# Patient Record
Sex: Male | Born: 1945
Health system: Southern US, Community
[De-identification: ages and names within clinical notes are randomized; demographics above are authoritative.]

## PROBLEM LIST (undated history)

## (undated) DIAGNOSIS — C4490 Unspecified malignant neoplasm of skin, unspecified: Secondary | ICD-10-CM

## (undated) DIAGNOSIS — E119 Type 2 diabetes mellitus without complications: Secondary | ICD-10-CM

## (undated) DIAGNOSIS — I011 Acute rheumatic endocarditis: Secondary | ICD-10-CM

## (undated) DIAGNOSIS — I10 Essential (primary) hypertension: Secondary | ICD-10-CM

## (undated) HISTORY — PX: FOOT SURGERY: SHX648

## (undated) HISTORY — DX: Unspecified malignant neoplasm of skin, unspecified: C44.90

## (undated) HISTORY — PX: ELBOW SURGERY: SHX618

## (undated) HISTORY — PX: CARPAL TUNNEL RELEASE: SHX101

## (undated) HISTORY — DX: Acute rheumatic endocarditis: I01.1

## (undated) HISTORY — DX: Essential (primary) hypertension: I10

## (undated) HISTORY — DX: Type 2 diabetes mellitus without complications: E11.9

---

## 2016-03-30 DIAGNOSIS — R1310 Dysphagia, unspecified: Secondary | ICD-10-CM | POA: Diagnosis not present

## 2016-04-13 DIAGNOSIS — T17300A Unspecified foreign body in larynx causing asphyxiation, initial encounter: Secondary | ICD-10-CM | POA: Diagnosis not present

## 2016-04-13 DIAGNOSIS — R1310 Dysphagia, unspecified: Secondary | ICD-10-CM | POA: Diagnosis not present

## 2016-05-10 DIAGNOSIS — M059 Rheumatoid arthritis with rheumatoid factor, unspecified: Secondary | ICD-10-CM | POA: Diagnosis not present

## 2016-05-10 DIAGNOSIS — Z79899 Other long term (current) drug therapy: Secondary | ICD-10-CM | POA: Diagnosis not present

## 2016-05-23 DIAGNOSIS — M059 Rheumatoid arthritis with rheumatoid factor, unspecified: Secondary | ICD-10-CM | POA: Diagnosis not present

## 2016-05-23 DIAGNOSIS — Z13820 Encounter for screening for osteoporosis: Secondary | ICD-10-CM | POA: Diagnosis not present

## 2016-05-23 DIAGNOSIS — R202 Paresthesia of skin: Secondary | ICD-10-CM | POA: Diagnosis not present

## 2016-05-23 DIAGNOSIS — Z79899 Other long term (current) drug therapy: Secondary | ICD-10-CM | POA: Diagnosis not present

## 2016-06-07 DIAGNOSIS — E559 Vitamin D deficiency, unspecified: Secondary | ICD-10-CM | POA: Diagnosis not present

## 2016-06-07 DIAGNOSIS — Z7952 Long term (current) use of systemic steroids: Secondary | ICD-10-CM | POA: Diagnosis not present

## 2016-06-07 DIAGNOSIS — E1165 Type 2 diabetes mellitus with hyperglycemia: Secondary | ICD-10-CM | POA: Diagnosis not present

## 2016-06-07 DIAGNOSIS — I10 Essential (primary) hypertension: Secondary | ICD-10-CM | POA: Diagnosis not present

## 2016-06-15 DIAGNOSIS — R1310 Dysphagia, unspecified: Secondary | ICD-10-CM | POA: Diagnosis not present

## 2016-06-20 DIAGNOSIS — M859 Disorder of bone density and structure, unspecified: Secondary | ICD-10-CM | POA: Diagnosis not present

## 2016-06-21 DIAGNOSIS — E1165 Type 2 diabetes mellitus with hyperglycemia: Secondary | ICD-10-CM | POA: Diagnosis not present

## 2016-07-06 DIAGNOSIS — M059 Rheumatoid arthritis with rheumatoid factor, unspecified: Secondary | ICD-10-CM | POA: Diagnosis not present

## 2016-07-06 DIAGNOSIS — Z79899 Other long term (current) drug therapy: Secondary | ICD-10-CM | POA: Diagnosis not present

## 2016-08-30 DIAGNOSIS — M059 Rheumatoid arthritis with rheumatoid factor, unspecified: Secondary | ICD-10-CM | POA: Diagnosis not present

## 2016-08-30 DIAGNOSIS — Z79899 Other long term (current) drug therapy: Secondary | ICD-10-CM | POA: Diagnosis not present

## 2016-08-31 DIAGNOSIS — M059 Rheumatoid arthritis with rheumatoid factor, unspecified: Secondary | ICD-10-CM | POA: Diagnosis not present

## 2016-08-31 DIAGNOSIS — Z79899 Other long term (current) drug therapy: Secondary | ICD-10-CM | POA: Diagnosis not present

## 2016-09-06 DIAGNOSIS — D1800 Hemangioma unspecified site: Secondary | ICD-10-CM | POA: Diagnosis not present

## 2016-09-06 DIAGNOSIS — L570 Actinic keratosis: Secondary | ICD-10-CM | POA: Diagnosis not present

## 2016-09-06 DIAGNOSIS — L814 Other melanin hyperpigmentation: Secondary | ICD-10-CM | POA: Diagnosis not present

## 2016-09-06 DIAGNOSIS — L821 Other seborrheic keratosis: Secondary | ICD-10-CM | POA: Diagnosis not present

## 2016-09-06 DIAGNOSIS — L820 Inflamed seborrheic keratosis: Secondary | ICD-10-CM | POA: Diagnosis not present

## 2016-09-06 DIAGNOSIS — Z85828 Personal history of other malignant neoplasm of skin: Secondary | ICD-10-CM | POA: Diagnosis not present

## 2016-09-06 DIAGNOSIS — L298 Other pruritus: Secondary | ICD-10-CM | POA: Diagnosis not present

## 2016-09-06 DIAGNOSIS — D485 Neoplasm of uncertain behavior of skin: Secondary | ICD-10-CM | POA: Diagnosis not present

## 2016-09-06 DIAGNOSIS — Z08 Encounter for follow-up examination after completed treatment for malignant neoplasm: Secondary | ICD-10-CM | POA: Diagnosis not present

## 2016-09-06 DIAGNOSIS — D229 Melanocytic nevi, unspecified: Secondary | ICD-10-CM | POA: Diagnosis not present

## 2016-09-13 DIAGNOSIS — G629 Polyneuropathy, unspecified: Secondary | ICD-10-CM | POA: Diagnosis not present

## 2016-09-13 DIAGNOSIS — G608 Other hereditary and idiopathic neuropathies: Secondary | ICD-10-CM | POA: Diagnosis not present

## 2016-09-13 DIAGNOSIS — I10 Essential (primary) hypertension: Secondary | ICD-10-CM | POA: Diagnosis not present

## 2016-09-13 DIAGNOSIS — E1165 Type 2 diabetes mellitus with hyperglycemia: Secondary | ICD-10-CM | POA: Diagnosis not present

## 2016-09-13 DIAGNOSIS — E559 Vitamin D deficiency, unspecified: Secondary | ICD-10-CM | POA: Diagnosis not present

## 2016-09-20 DIAGNOSIS — M79672 Pain in left foot: Secondary | ICD-10-CM | POA: Diagnosis not present

## 2016-09-26 DIAGNOSIS — E119 Type 2 diabetes mellitus without complications: Secondary | ICD-10-CM | POA: Diagnosis not present

## 2016-09-26 DIAGNOSIS — M059 Rheumatoid arthritis with rheumatoid factor, unspecified: Secondary | ICD-10-CM | POA: Diagnosis not present

## 2016-09-26 DIAGNOSIS — Z794 Long term (current) use of insulin: Secondary | ICD-10-CM | POA: Diagnosis not present

## 2016-09-26 DIAGNOSIS — R001 Bradycardia, unspecified: Secondary | ICD-10-CM | POA: Diagnosis not present

## 2016-09-29 DIAGNOSIS — M50323 Other cervical disc degeneration at C6-C7 level: Secondary | ICD-10-CM | POA: Diagnosis not present

## 2016-09-29 DIAGNOSIS — M5137 Other intervertebral disc degeneration, lumbosacral region: Secondary | ICD-10-CM | POA: Diagnosis not present

## 2016-09-29 DIAGNOSIS — M50322 Other cervical disc degeneration at C5-C6 level: Secondary | ICD-10-CM | POA: Diagnosis not present

## 2016-09-29 DIAGNOSIS — M545 Low back pain: Secondary | ICD-10-CM | POA: Diagnosis not present

## 2016-09-29 DIAGNOSIS — E1142 Type 2 diabetes mellitus with diabetic polyneuropathy: Secondary | ICD-10-CM | POA: Diagnosis not present

## 2016-09-29 DIAGNOSIS — M4316 Spondylolisthesis, lumbar region: Secondary | ICD-10-CM | POA: Diagnosis not present

## 2016-09-29 DIAGNOSIS — Z6837 Body mass index (BMI) 37.0-37.9, adult: Secondary | ICD-10-CM | POA: Diagnosis not present

## 2016-09-29 DIAGNOSIS — R208 Other disturbances of skin sensation: Secondary | ICD-10-CM | POA: Diagnosis not present

## 2016-10-11 DIAGNOSIS — M4316 Spondylolisthesis, lumbar region: Secondary | ICD-10-CM | POA: Diagnosis not present

## 2016-10-11 DIAGNOSIS — M5137 Other intervertebral disc degeneration, lumbosacral region: Secondary | ICD-10-CM | POA: Diagnosis not present

## 2016-10-25 DIAGNOSIS — Z6837 Body mass index (BMI) 37.0-37.9, adult: Secondary | ICD-10-CM | POA: Diagnosis not present

## 2016-10-25 DIAGNOSIS — G629 Polyneuropathy, unspecified: Secondary | ICD-10-CM | POA: Diagnosis not present

## 2016-10-25 DIAGNOSIS — E1165 Type 2 diabetes mellitus with hyperglycemia: Secondary | ICD-10-CM | POA: Diagnosis not present

## 2016-10-27 DIAGNOSIS — M05821 Other rheumatoid arthritis with rheumatoid factor of right elbow: Secondary | ICD-10-CM | POA: Diagnosis not present

## 2016-10-27 DIAGNOSIS — M05822 Other rheumatoid arthritis with rheumatoid factor of left elbow: Secondary | ICD-10-CM | POA: Diagnosis not present

## 2016-10-27 DIAGNOSIS — M069 Rheumatoid arthritis, unspecified: Secondary | ICD-10-CM | POA: Diagnosis not present

## 2016-11-08 DIAGNOSIS — R208 Other disturbances of skin sensation: Secondary | ICD-10-CM | POA: Diagnosis not present

## 2016-11-22 DIAGNOSIS — M8580 Other specified disorders of bone density and structure, unspecified site: Secondary | ICD-10-CM | POA: Diagnosis not present

## 2016-11-22 DIAGNOSIS — G629 Polyneuropathy, unspecified: Secondary | ICD-10-CM | POA: Diagnosis not present

## 2016-11-22 DIAGNOSIS — M79671 Pain in right foot: Secondary | ICD-10-CM | POA: Diagnosis not present

## 2016-11-22 DIAGNOSIS — M059 Rheumatoid arthritis with rheumatoid factor, unspecified: Secondary | ICD-10-CM | POA: Diagnosis not present

## 2016-11-22 DIAGNOSIS — Z79899 Other long term (current) drug therapy: Secondary | ICD-10-CM | POA: Diagnosis not present

## 2016-11-22 DIAGNOSIS — G8929 Other chronic pain: Secondary | ICD-10-CM | POA: Diagnosis not present

## 2016-11-22 DIAGNOSIS — M79672 Pain in left foot: Secondary | ICD-10-CM | POA: Diagnosis not present

## 2016-11-30 DIAGNOSIS — M50323 Other cervical disc degeneration at C6-C7 level: Secondary | ICD-10-CM | POA: Diagnosis not present

## 2016-11-30 DIAGNOSIS — M50322 Other cervical disc degeneration at C5-C6 level: Secondary | ICD-10-CM | POA: Diagnosis not present

## 2016-11-30 DIAGNOSIS — E1142 Type 2 diabetes mellitus with diabetic polyneuropathy: Secondary | ICD-10-CM | POA: Diagnosis not present

## 2016-11-30 DIAGNOSIS — G5602 Carpal tunnel syndrome, left upper limb: Secondary | ICD-10-CM | POA: Diagnosis not present

## 2016-11-30 DIAGNOSIS — M5137 Other intervertebral disc degeneration, lumbosacral region: Secondary | ICD-10-CM | POA: Diagnosis not present

## 2016-11-30 DIAGNOSIS — Z6837 Body mass index (BMI) 37.0-37.9, adult: Secondary | ICD-10-CM | POA: Diagnosis not present

## 2016-11-30 DIAGNOSIS — M436 Torticollis: Secondary | ICD-10-CM | POA: Diagnosis not present

## 2016-12-15 DIAGNOSIS — G5602 Carpal tunnel syndrome, left upper limb: Secondary | ICD-10-CM | POA: Diagnosis not present

## 2016-12-15 DIAGNOSIS — G5622 Lesion of ulnar nerve, left upper limb: Secondary | ICD-10-CM | POA: Diagnosis not present

## 2016-12-20 DIAGNOSIS — E119 Type 2 diabetes mellitus without complications: Secondary | ICD-10-CM | POA: Diagnosis not present

## 2016-12-20 DIAGNOSIS — E559 Vitamin D deficiency, unspecified: Secondary | ICD-10-CM | POA: Diagnosis not present

## 2016-12-20 DIAGNOSIS — I493 Ventricular premature depolarization: Secondary | ICD-10-CM | POA: Diagnosis not present

## 2016-12-20 DIAGNOSIS — I10 Essential (primary) hypertension: Secondary | ICD-10-CM | POA: Diagnosis not present

## 2016-12-22 DIAGNOSIS — M069 Rheumatoid arthritis, unspecified: Secondary | ICD-10-CM | POA: Diagnosis not present

## 2016-12-22 DIAGNOSIS — M05821 Other rheumatoid arthritis with rheumatoid factor of right elbow: Secondary | ICD-10-CM | POA: Diagnosis not present

## 2016-12-22 DIAGNOSIS — M05822 Other rheumatoid arthritis with rheumatoid factor of left elbow: Secondary | ICD-10-CM | POA: Diagnosis not present

## 2016-12-23 DIAGNOSIS — M545 Low back pain: Secondary | ICD-10-CM | POA: Diagnosis not present

## 2016-12-27 DIAGNOSIS — E1165 Type 2 diabetes mellitus with hyperglycemia: Secondary | ICD-10-CM | POA: Diagnosis not present

## 2016-12-27 DIAGNOSIS — Z23 Encounter for immunization: Secondary | ICD-10-CM | POA: Diagnosis not present

## 2016-12-27 DIAGNOSIS — I10 Essential (primary) hypertension: Secondary | ICD-10-CM | POA: Diagnosis not present

## 2016-12-27 DIAGNOSIS — G629 Polyneuropathy, unspecified: Secondary | ICD-10-CM | POA: Diagnosis not present

## 2017-01-09 DIAGNOSIS — M48061 Spinal stenosis, lumbar region without neurogenic claudication: Secondary | ICD-10-CM | POA: Diagnosis not present

## 2017-01-09 DIAGNOSIS — M5137 Other intervertebral disc degeneration, lumbosacral region: Secondary | ICD-10-CM | POA: Diagnosis not present

## 2017-01-09 DIAGNOSIS — M5032 Other cervical disc degeneration, mid-cervical region, unspecified level: Secondary | ICD-10-CM | POA: Diagnosis not present

## 2017-01-09 DIAGNOSIS — M50323 Other cervical disc degeneration at C6-C7 level: Secondary | ICD-10-CM | POA: Diagnosis not present

## 2017-01-09 DIAGNOSIS — M4316 Spondylolisthesis, lumbar region: Secondary | ICD-10-CM | POA: Diagnosis not present

## 2017-01-09 DIAGNOSIS — E1142 Type 2 diabetes mellitus with diabetic polyneuropathy: Secondary | ICD-10-CM | POA: Diagnosis not present

## 2017-01-09 DIAGNOSIS — G5602 Carpal tunnel syndrome, left upper limb: Secondary | ICD-10-CM | POA: Diagnosis not present

## 2017-01-09 DIAGNOSIS — Z6837 Body mass index (BMI) 37.0-37.9, adult: Secondary | ICD-10-CM | POA: Diagnosis not present

## 2017-01-09 DIAGNOSIS — M50322 Other cervical disc degeneration at C5-C6 level: Secondary | ICD-10-CM | POA: Diagnosis not present

## 2017-01-24 DIAGNOSIS — E662 Morbid (severe) obesity with alveolar hypoventilation: Secondary | ICD-10-CM | POA: Diagnosis not present

## 2017-01-24 DIAGNOSIS — M48062 Spinal stenosis, lumbar region with neurogenic claudication: Secondary | ICD-10-CM | POA: Diagnosis not present

## 2017-01-24 DIAGNOSIS — M4316 Spondylolisthesis, lumbar region: Secondary | ICD-10-CM | POA: Diagnosis not present

## 2017-02-14 DIAGNOSIS — Z79899 Other long term (current) drug therapy: Secondary | ICD-10-CM | POA: Diagnosis not present

## 2017-02-14 DIAGNOSIS — M069 Rheumatoid arthritis, unspecified: Secondary | ICD-10-CM | POA: Diagnosis not present

## 2017-02-20 DIAGNOSIS — R42 Dizziness and giddiness: Secondary | ICD-10-CM | POA: Diagnosis not present

## 2017-02-20 DIAGNOSIS — I10 Essential (primary) hypertension: Secondary | ICD-10-CM | POA: Diagnosis not present

## 2017-02-20 DIAGNOSIS — I493 Ventricular premature depolarization: Secondary | ICD-10-CM | POA: Diagnosis not present

## 2017-02-21 DIAGNOSIS — M5137 Other intervertebral disc degeneration, lumbosacral region: Secondary | ICD-10-CM | POA: Diagnosis not present

## 2017-02-21 DIAGNOSIS — M4310 Spondylolisthesis, site unspecified: Secondary | ICD-10-CM | POA: Diagnosis not present

## 2017-02-28 DIAGNOSIS — M5137 Other intervertebral disc degeneration, lumbosacral region: Secondary | ICD-10-CM | POA: Diagnosis not present

## 2017-02-28 DIAGNOSIS — M4310 Spondylolisthesis, site unspecified: Secondary | ICD-10-CM | POA: Diagnosis not present

## 2017-03-06 DIAGNOSIS — M4310 Spondylolisthesis, site unspecified: Secondary | ICD-10-CM | POA: Diagnosis not present

## 2017-03-06 DIAGNOSIS — M5137 Other intervertebral disc degeneration, lumbosacral region: Secondary | ICD-10-CM | POA: Diagnosis not present

## 2017-03-07 DIAGNOSIS — Z85828 Personal history of other malignant neoplasm of skin: Secondary | ICD-10-CM | POA: Diagnosis not present

## 2017-03-07 DIAGNOSIS — L814 Other melanin hyperpigmentation: Secondary | ICD-10-CM | POA: Diagnosis not present

## 2017-03-07 DIAGNOSIS — L821 Other seborrheic keratosis: Secondary | ICD-10-CM | POA: Diagnosis not present

## 2017-03-07 DIAGNOSIS — L910 Hypertrophic scar: Secondary | ICD-10-CM | POA: Diagnosis not present

## 2017-03-07 DIAGNOSIS — Z08 Encounter for follow-up examination after completed treatment for malignant neoplasm: Secondary | ICD-10-CM | POA: Diagnosis not present

## 2017-03-07 DIAGNOSIS — D229 Melanocytic nevi, unspecified: Secondary | ICD-10-CM | POA: Diagnosis not present

## 2017-03-07 DIAGNOSIS — D1800 Hemangioma unspecified site: Secondary | ICD-10-CM | POA: Diagnosis not present

## 2017-03-07 DIAGNOSIS — L570 Actinic keratosis: Secondary | ICD-10-CM | POA: Diagnosis not present

## 2017-03-07 DIAGNOSIS — D485 Neoplasm of uncertain behavior of skin: Secondary | ICD-10-CM | POA: Diagnosis not present

## 2017-03-09 DIAGNOSIS — I493 Ventricular premature depolarization: Secondary | ICD-10-CM | POA: Diagnosis not present

## 2017-03-09 DIAGNOSIS — R42 Dizziness and giddiness: Secondary | ICD-10-CM | POA: Diagnosis not present

## 2017-04-04 DIAGNOSIS — Z79899 Other long term (current) drug therapy: Secondary | ICD-10-CM | POA: Diagnosis not present

## 2017-04-04 DIAGNOSIS — M8580 Other specified disorders of bone density and structure, unspecified site: Secondary | ICD-10-CM | POA: Diagnosis not present

## 2017-04-04 DIAGNOSIS — E1165 Type 2 diabetes mellitus with hyperglycemia: Secondary | ICD-10-CM | POA: Diagnosis not present

## 2017-04-04 DIAGNOSIS — Z7952 Long term (current) use of systemic steroids: Secondary | ICD-10-CM | POA: Diagnosis not present

## 2017-04-04 DIAGNOSIS — E559 Vitamin D deficiency, unspecified: Secondary | ICD-10-CM | POA: Diagnosis not present

## 2017-04-10 DIAGNOSIS — Z7952 Long term (current) use of systemic steroids: Secondary | ICD-10-CM | POA: Diagnosis not present

## 2017-04-10 DIAGNOSIS — M059 Rheumatoid arthritis with rheumatoid factor, unspecified: Secondary | ICD-10-CM | POA: Diagnosis not present

## 2017-04-10 DIAGNOSIS — Z79899 Other long term (current) drug therapy: Secondary | ICD-10-CM | POA: Diagnosis not present

## 2017-04-11 DIAGNOSIS — E119 Type 2 diabetes mellitus without complications: Secondary | ICD-10-CM | POA: Diagnosis not present

## 2017-04-11 DIAGNOSIS — Z794 Long term (current) use of insulin: Secondary | ICD-10-CM | POA: Diagnosis not present

## 2017-04-11 DIAGNOSIS — Z6837 Body mass index (BMI) 37.0-37.9, adult: Secondary | ICD-10-CM | POA: Diagnosis not present

## 2017-04-11 DIAGNOSIS — G629 Polyneuropathy, unspecified: Secondary | ICD-10-CM | POA: Diagnosis not present

## 2017-04-11 DIAGNOSIS — I10 Essential (primary) hypertension: Secondary | ICD-10-CM | POA: Diagnosis not present

## 2017-04-25 DIAGNOSIS — M4316 Spondylolisthesis, lumbar region: Secondary | ICD-10-CM | POA: Diagnosis not present

## 2017-05-04 DIAGNOSIS — K122 Cellulitis and abscess of mouth: Secondary | ICD-10-CM | POA: Diagnosis not present

## 2017-05-04 DIAGNOSIS — Z6836 Body mass index (BMI) 36.0-36.9, adult: Secondary | ICD-10-CM | POA: Diagnosis not present

## 2017-05-04 DIAGNOSIS — Z0130 Encounter for examination of blood pressure without abnormal findings: Secondary | ICD-10-CM | POA: Diagnosis not present

## 2017-05-09 DIAGNOSIS — Z79899 Other long term (current) drug therapy: Secondary | ICD-10-CM | POA: Diagnosis not present

## 2017-05-09 DIAGNOSIS — M059 Rheumatoid arthritis with rheumatoid factor, unspecified: Secondary | ICD-10-CM | POA: Diagnosis not present

## 2017-05-09 DIAGNOSIS — Z7952 Long term (current) use of systemic steroids: Secondary | ICD-10-CM | POA: Diagnosis not present

## 2017-05-09 DIAGNOSIS — Z0000 Encounter for general adult medical examination without abnormal findings: Secondary | ICD-10-CM | POA: Diagnosis not present

## 2017-05-25 DIAGNOSIS — M8580 Other specified disorders of bone density and structure, unspecified site: Secondary | ICD-10-CM | POA: Diagnosis not present

## 2017-05-25 DIAGNOSIS — Z79899 Other long term (current) drug therapy: Secondary | ICD-10-CM | POA: Diagnosis not present

## 2017-05-25 DIAGNOSIS — G629 Polyneuropathy, unspecified: Secondary | ICD-10-CM | POA: Diagnosis not present

## 2017-05-25 DIAGNOSIS — M2141 Flat foot [pes planus] (acquired), right foot: Secondary | ICD-10-CM | POA: Diagnosis not present

## 2017-05-25 DIAGNOSIS — M2142 Flat foot [pes planus] (acquired), left foot: Secondary | ICD-10-CM | POA: Diagnosis not present

## 2017-05-25 DIAGNOSIS — M059 Rheumatoid arthritis with rheumatoid factor, unspecified: Secondary | ICD-10-CM | POA: Diagnosis not present

## 2017-05-25 DIAGNOSIS — G5602 Carpal tunnel syndrome, left upper limb: Secondary | ICD-10-CM | POA: Diagnosis not present

## 2017-05-31 DIAGNOSIS — E119 Type 2 diabetes mellitus without complications: Secondary | ICD-10-CM | POA: Diagnosis not present

## 2017-05-31 DIAGNOSIS — G5622 Lesion of ulnar nerve, left upper limb: Secondary | ICD-10-CM | POA: Diagnosis not present

## 2017-05-31 DIAGNOSIS — K219 Gastro-esophageal reflux disease without esophagitis: Secondary | ICD-10-CM | POA: Diagnosis not present

## 2017-05-31 DIAGNOSIS — G5602 Carpal tunnel syndrome, left upper limb: Secondary | ICD-10-CM | POA: Diagnosis not present

## 2017-05-31 DIAGNOSIS — I10 Essential (primary) hypertension: Secondary | ICD-10-CM | POA: Diagnosis not present

## 2017-06-05 DIAGNOSIS — Z7952 Long term (current) use of systemic steroids: Secondary | ICD-10-CM | POA: Diagnosis not present

## 2017-06-05 DIAGNOSIS — Z79899 Other long term (current) drug therapy: Secondary | ICD-10-CM | POA: Diagnosis not present

## 2017-06-05 DIAGNOSIS — M059 Rheumatoid arthritis with rheumatoid factor, unspecified: Secondary | ICD-10-CM | POA: Diagnosis not present

## 2017-06-22 DIAGNOSIS — M4316 Spondylolisthesis, lumbar region: Secondary | ICD-10-CM | POA: Diagnosis not present

## 2017-06-22 DIAGNOSIS — M545 Low back pain: Secondary | ICD-10-CM | POA: Diagnosis not present

## 2017-08-04 DIAGNOSIS — M059 Rheumatoid arthritis with rheumatoid factor, unspecified: Secondary | ICD-10-CM | POA: Diagnosis not present

## 2017-08-04 DIAGNOSIS — Z79899 Other long term (current) drug therapy: Secondary | ICD-10-CM | POA: Diagnosis not present

## 2017-09-05 DIAGNOSIS — Z08 Encounter for follow-up examination after completed treatment for malignant neoplasm: Secondary | ICD-10-CM | POA: Diagnosis not present

## 2017-09-05 DIAGNOSIS — L820 Inflamed seborrheic keratosis: Secondary | ICD-10-CM | POA: Diagnosis not present

## 2017-09-05 DIAGNOSIS — D1800 Hemangioma unspecified site: Secondary | ICD-10-CM | POA: Diagnosis not present

## 2017-09-05 DIAGNOSIS — L814 Other melanin hyperpigmentation: Secondary | ICD-10-CM | POA: Diagnosis not present

## 2017-09-05 DIAGNOSIS — Z9189 Other specified personal risk factors, not elsewhere classified: Secondary | ICD-10-CM | POA: Diagnosis not present

## 2017-09-05 DIAGNOSIS — L821 Other seborrheic keratosis: Secondary | ICD-10-CM | POA: Diagnosis not present

## 2017-09-05 DIAGNOSIS — Z85828 Personal history of other malignant neoplasm of skin: Secondary | ICD-10-CM | POA: Diagnosis not present

## 2017-09-05 DIAGNOSIS — L570 Actinic keratosis: Secondary | ICD-10-CM | POA: Diagnosis not present

## 2017-09-05 DIAGNOSIS — L298 Other pruritus: Secondary | ICD-10-CM | POA: Diagnosis not present

## 2017-09-05 DIAGNOSIS — D229 Melanocytic nevi, unspecified: Secondary | ICD-10-CM | POA: Diagnosis not present

## 2017-10-02 DIAGNOSIS — M059 Rheumatoid arthritis with rheumatoid factor, unspecified: Secondary | ICD-10-CM | POA: Diagnosis not present

## 2017-10-02 DIAGNOSIS — Z79899 Other long term (current) drug therapy: Secondary | ICD-10-CM | POA: Diagnosis not present

## 2017-10-03 DIAGNOSIS — K219 Gastro-esophageal reflux disease without esophagitis: Secondary | ICD-10-CM | POA: Diagnosis not present

## 2017-10-03 DIAGNOSIS — I10 Essential (primary) hypertension: Secondary | ICD-10-CM | POA: Diagnosis not present

## 2017-10-03 DIAGNOSIS — Z136 Encounter for screening for cardiovascular disorders: Secondary | ICD-10-CM | POA: Diagnosis not present

## 2017-10-03 DIAGNOSIS — E119 Type 2 diabetes mellitus without complications: Secondary | ICD-10-CM | POA: Diagnosis not present

## 2017-10-03 DIAGNOSIS — E559 Vitamin D deficiency, unspecified: Secondary | ICD-10-CM | POA: Diagnosis not present

## 2017-10-10 DIAGNOSIS — E119 Type 2 diabetes mellitus without complications: Secondary | ICD-10-CM | POA: Diagnosis not present

## 2017-10-10 DIAGNOSIS — G629 Polyneuropathy, unspecified: Secondary | ICD-10-CM | POA: Diagnosis not present

## 2017-10-10 DIAGNOSIS — I10 Essential (primary) hypertension: Secondary | ICD-10-CM | POA: Diagnosis not present

## 2017-10-10 DIAGNOSIS — Z794 Long term (current) use of insulin: Secondary | ICD-10-CM | POA: Diagnosis not present

## 2017-10-24 DIAGNOSIS — Z4789 Encounter for other orthopedic aftercare: Secondary | ICD-10-CM | POA: Diagnosis not present

## 2017-10-24 DIAGNOSIS — G5602 Carpal tunnel syndrome, left upper limb: Secondary | ICD-10-CM | POA: Diagnosis not present

## 2017-10-24 DIAGNOSIS — G5622 Lesion of ulnar nerve, left upper limb: Secondary | ICD-10-CM | POA: Diagnosis not present

## 2018-01-11 DIAGNOSIS — G629 Polyneuropathy, unspecified: Secondary | ICD-10-CM | POA: Diagnosis not present

## 2018-01-11 DIAGNOSIS — L578 Other skin changes due to chronic exposure to nonionizing radiation: Secondary | ICD-10-CM | POA: Diagnosis not present

## 2018-01-11 DIAGNOSIS — Z794 Long term (current) use of insulin: Secondary | ICD-10-CM | POA: Diagnosis not present

## 2018-01-11 DIAGNOSIS — I10 Essential (primary) hypertension: Secondary | ICD-10-CM | POA: Diagnosis not present

## 2018-01-11 DIAGNOSIS — L821 Other seborrheic keratosis: Secondary | ICD-10-CM | POA: Diagnosis not present

## 2018-01-11 DIAGNOSIS — M069 Rheumatoid arthritis, unspecified: Secondary | ICD-10-CM | POA: Diagnosis not present

## 2018-01-11 DIAGNOSIS — E1169 Type 2 diabetes mellitus with other specified complication: Secondary | ICD-10-CM | POA: Diagnosis not present

## 2018-01-11 DIAGNOSIS — Z6836 Body mass index (BMI) 36.0-36.9, adult: Secondary | ICD-10-CM | POA: Diagnosis not present

## 2018-01-11 DIAGNOSIS — M5412 Radiculopathy, cervical region: Secondary | ICD-10-CM | POA: Diagnosis not present

## 2018-01-11 DIAGNOSIS — L570 Actinic keratosis: Secondary | ICD-10-CM | POA: Diagnosis not present

## 2018-01-19 DIAGNOSIS — M5412 Radiculopathy, cervical region: Secondary | ICD-10-CM | POA: Diagnosis not present

## 2018-01-19 DIAGNOSIS — Z6835 Body mass index (BMI) 35.0-35.9, adult: Secondary | ICD-10-CM | POA: Diagnosis not present

## 2018-01-19 DIAGNOSIS — I10 Essential (primary) hypertension: Secondary | ICD-10-CM | POA: Diagnosis not present

## 2018-02-01 DIAGNOSIS — B351 Tinea unguium: Secondary | ICD-10-CM | POA: Diagnosis not present

## 2018-03-06 DIAGNOSIS — G629 Polyneuropathy, unspecified: Secondary | ICD-10-CM | POA: Diagnosis not present

## 2018-03-06 DIAGNOSIS — Z6834 Body mass index (BMI) 34.0-34.9, adult: Secondary | ICD-10-CM | POA: Diagnosis not present

## 2018-03-06 DIAGNOSIS — E669 Obesity, unspecified: Secondary | ICD-10-CM | POA: Diagnosis not present

## 2018-03-06 DIAGNOSIS — M0579 Rheumatoid arthritis with rheumatoid factor of multiple sites without organ or systems involvement: Secondary | ICD-10-CM | POA: Diagnosis not present

## 2018-03-06 DIAGNOSIS — M150 Primary generalized (osteo)arthritis: Secondary | ICD-10-CM | POA: Diagnosis not present

## 2018-03-12 DIAGNOSIS — M0579 Rheumatoid arthritis with rheumatoid factor of multiple sites without organ or systems involvement: Secondary | ICD-10-CM | POA: Diagnosis not present

## 2018-03-14 DIAGNOSIS — Z794 Long term (current) use of insulin: Secondary | ICD-10-CM | POA: Diagnosis not present

## 2018-03-14 DIAGNOSIS — I10 Essential (primary) hypertension: Secondary | ICD-10-CM | POA: Diagnosis not present

## 2018-03-14 DIAGNOSIS — M0579 Rheumatoid arthritis with rheumatoid factor of multiple sites without organ or systems involvement: Secondary | ICD-10-CM | POA: Diagnosis not present

## 2018-03-14 DIAGNOSIS — R001 Bradycardia, unspecified: Secondary | ICD-10-CM | POA: Diagnosis not present

## 2018-03-14 DIAGNOSIS — E119 Type 2 diabetes mellitus without complications: Secondary | ICD-10-CM | POA: Diagnosis not present

## 2018-03-26 DIAGNOSIS — M0579 Rheumatoid arthritis with rheumatoid factor of multiple sites without organ or systems involvement: Secondary | ICD-10-CM | POA: Diagnosis not present

## 2018-03-29 DIAGNOSIS — E1169 Type 2 diabetes mellitus with other specified complication: Secondary | ICD-10-CM | POA: Diagnosis not present

## 2018-03-29 DIAGNOSIS — Z6835 Body mass index (BMI) 35.0-35.9, adult: Secondary | ICD-10-CM | POA: Diagnosis not present

## 2018-03-29 DIAGNOSIS — M2060 Acquired deformities of toe(s), unspecified, unspecified foot: Secondary | ICD-10-CM | POA: Diagnosis not present

## 2018-03-29 DIAGNOSIS — R634 Abnormal weight loss: Secondary | ICD-10-CM | POA: Diagnosis not present

## 2018-03-30 DIAGNOSIS — L501 Idiopathic urticaria: Secondary | ICD-10-CM | POA: Diagnosis not present

## 2018-04-06 DIAGNOSIS — M19271 Secondary osteoarthritis, right ankle and foot: Secondary | ICD-10-CM | POA: Diagnosis not present

## 2018-04-06 DIAGNOSIS — M2041 Other hammer toe(s) (acquired), right foot: Secondary | ICD-10-CM | POA: Diagnosis not present

## 2018-04-06 DIAGNOSIS — E1140 Type 2 diabetes mellitus with diabetic neuropathy, unspecified: Secondary | ICD-10-CM | POA: Diagnosis not present

## 2018-04-06 DIAGNOSIS — M069 Rheumatoid arthritis, unspecified: Secondary | ICD-10-CM | POA: Diagnosis not present

## 2018-04-06 DIAGNOSIS — M25572 Pain in left ankle and joints of left foot: Secondary | ICD-10-CM | POA: Diagnosis not present

## 2018-04-06 DIAGNOSIS — M25571 Pain in right ankle and joints of right foot: Secondary | ICD-10-CM | POA: Diagnosis not present

## 2018-04-06 DIAGNOSIS — M2042 Other hammer toe(s) (acquired), left foot: Secondary | ICD-10-CM | POA: Diagnosis not present

## 2018-04-19 DIAGNOSIS — Z6833 Body mass index (BMI) 33.0-33.9, adult: Secondary | ICD-10-CM | POA: Diagnosis not present

## 2018-04-19 DIAGNOSIS — I10 Essential (primary) hypertension: Secondary | ICD-10-CM | POA: Diagnosis not present

## 2018-04-19 DIAGNOSIS — R200 Anesthesia of skin: Secondary | ICD-10-CM | POA: Diagnosis not present

## 2018-04-23 DIAGNOSIS — M0579 Rheumatoid arthritis with rheumatoid factor of multiple sites without organ or systems involvement: Secondary | ICD-10-CM | POA: Diagnosis not present

## 2018-05-10 DIAGNOSIS — Z0000 Encounter for general adult medical examination without abnormal findings: Secondary | ICD-10-CM | POA: Diagnosis not present

## 2018-05-10 DIAGNOSIS — Z1331 Encounter for screening for depression: Secondary | ICD-10-CM | POA: Diagnosis not present

## 2018-05-10 DIAGNOSIS — E1169 Type 2 diabetes mellitus with other specified complication: Secondary | ICD-10-CM | POA: Diagnosis not present

## 2018-05-10 DIAGNOSIS — Z1339 Encounter for screening examination for other mental health and behavioral disorders: Secondary | ICD-10-CM | POA: Diagnosis not present

## 2018-05-10 DIAGNOSIS — Z79899 Other long term (current) drug therapy: Secondary | ICD-10-CM | POA: Diagnosis not present

## 2018-05-10 DIAGNOSIS — Z7189 Other specified counseling: Secondary | ICD-10-CM | POA: Diagnosis not present

## 2018-05-16 ENCOUNTER — Telehealth: Payer: Self-pay | Admitting: Neurology

## 2018-05-16 ENCOUNTER — Encounter: Payer: Self-pay | Admitting: Neurology

## 2018-05-16 ENCOUNTER — Ambulatory Visit (INDEPENDENT_AMBULATORY_CARE_PROVIDER_SITE_OTHER): Payer: Medicare Other | Admitting: Neurology

## 2018-05-16 VITALS — BP 132/72 | HR 84 | Ht 71.0 in | Wt 237.0 lb

## 2018-05-16 DIAGNOSIS — R202 Paresthesia of skin: Secondary | ICD-10-CM | POA: Insufficient documentation

## 2018-05-16 DIAGNOSIS — G3281 Cerebellar ataxia in diseases classified elsewhere: Secondary | ICD-10-CM

## 2018-05-16 DIAGNOSIS — R531 Weakness: Secondary | ICD-10-CM

## 2018-05-16 DIAGNOSIS — R269 Unspecified abnormalities of gait and mobility: Secondary | ICD-10-CM

## 2018-05-16 NOTE — Telephone Encounter (Signed)
Medicare/aarp order sent to [facility]. No auth they will reach out to the pt to schedule.  °

## 2018-05-16 NOTE — Progress Notes (Addendum)
PATIENT: Harry Burton DOB: Jun 09, 1945  Chief Complaint  Patient presents with  . Hand Numbness    Reports constant, decreased sensations in his bilateral hands (left side worse than right).  Symptoms present for 5-6 years. He has noticed recent worsening.     . Neurosurgeon    Ostergard, Joyice Faster, MD (in the process of changing PCP)     HISTORICAL  Harry Burton is a 73 years old male, seen in request by his neurosurgeon Dr Emelda Brothers A, for evaluation of bilateral hands paresthesia initial evaluation was on May 16, 2018.  I have reviewed and summarized the referring note from the referring physician.  He had past medical history of hypertension, diabetes, rheumatoid arthritis since age 14  He had gradual onset deformity of left foot, used to enjoying hiking, then developed left foot pain, difficulty walking required reconstruction surgery of left foot in 2016, after that, he began to notice left foot numbness, denies significant pain, gradually getting worse, especially after he bear weight for a while, he has dense left foot numbness, give out underneath him, he has to rest for a while.  He has history of diabetes since 2015, diabetes under good control, Accu-Chek range was in 90-120.  In 2012, he had an episode of radiating pain from left shoulder to left arm, left hand, lasting for 6 weeks, gradually resolved, had recurrent episode in 2014, considered due to his degenerative cervical spine disease, symptoms improved with neck contraction, and epidural shock, he reported that with his neck stretch, contraction, his left arm symptoms were totally disappear.  In 2016, he began to have recurrent left arm symptoms again, numbness tingling, weakness, difficulty opening the jar, reported abnormal EMG nerve conduction study from outside hospital, he eventually had left median neuropathy and ulnar neuropathy decompression surgery in 2019, which failed to improve his symptoms at  all.  He reported previous MRI of cervical, and the lumbar spine showed multilevel degenerative changes, but not a surgical candidate.  He moved from [place] to [place] in August 2019, previous EMG nerve conduction study and MRI was all done at [place].  Per record MRI of lumbar from September 2018 severe bilateral moderate foraminal stenosis L3-4, severe stenosis at L4-5, also has neck and arm symptoms,   REVIEW OF SYSTEMS: Full 14 system review of systems performed and notable only for numbness, weakness, not enough sleep, cramps, hearing loss, ringing in ears, weight loss All other review of systems were negative.  ALLERGIES: Allergies  Allergen Reactions  . Penicillin G Rash    HOME MEDICATIONS: Current Outpatient Medications  Medication Sig Dispense Refill  . ASPIRIN 81 PO Take 81 mg by mouth daily.    Marland Kitchen BIOTIN PO Take 5,000 mcg by mouth daily.    Marland Kitchen CALCIUM PO Take 5,000 mg by mouth daily.    . Cyanocobalamin (B-12 PO) Take 5,000 mcg by mouth daily.    . folic acid (FOLVITE) 1 MG tablet Take by mouth.    . inFLIXimab (REMICADE IV) Inject into the vein every 8 (eight) weeks.    Marland Kitchen LEVEMIR FLEXTOUCH 100 UNIT/ML Pen INJECT 60 UNITS UNDER THE SKIN QD    . lisinopril (PRINIVIL,ZESTRIL) 5 MG tablet Take 5 mg by mouth daily.    . metFORMIN (GLUCOPHAGE-XR) 500 MG 24 hr tablet TK 2 TS PO BID    . Methotrexate, PF, 10 MG/0.4ML SOAJ Inject into the skin.    . ranitidine (ZANTAC) 150 MG tablet Take by mouth.    Marland Kitchen  VITAMIN D PO Take 5,000 Units by mouth daily.     No current facility-administered medications for this visit.     PAST MEDICAL HISTORY: Past Medical History:  Diagnosis Date  . Diabetes ([place])   . Hypertension   . Rheumatoid aortitis   . Skin cancer    basal cell    PAST SURGICAL HISTORY: Past Surgical History:  Procedure Laterality Date  . CARPAL TUNNEL RELEASE Left   . ELBOW SURGERY Left   . FOOT SURGERY Bilateral     FAMILY HISTORY: Family History    Problem Relation Age of Onset  . Stroke Mother   . Rheum arthritis Father   . Cancer Father        unsure of type    SOCIAL HISTORY: Social History   Socioeconomic History  . Marital status: Single    Spouse name: Not on file  . Number of children: 1  . Years of education: college  . Highest education level: Bachelor's degree (e.g., BA, AB, BS)  Occupational History  . Occupation: Retired   Scientific laboratory technician  . Financial resource strain: Not on file  . Food insecurity:    Worry: Not on file    Inability: Not on file  . Transportation needs:    Medical: Not on file    Non-medical: Not on file  Tobacco Use  . Smoking status: Never Smoker  . Smokeless tobacco: Never Used  Substance and Sexual Activity  . Alcohol use: Not Currently  . Drug use: Never  . Sexual activity: Not on file  Lifestyle  . Physical activity:    Days per week: Not on file    Minutes per session: Not on file  . Stress: Not on file  Relationships  . Social connections:    Talks on phone: Not on file    Gets together: Not on file    Attends religious service: Not on file    Active member of club or organization: Not on file    Attends meetings of clubs or organizations: Not on file    Relationship status: Not on file  . Intimate partner violence:    Fear of current or ex partner: Not on file    Emotionally abused: Not on file    Physically abused: Not on file    Forced sexual activity: Not on file  Other Topics Concern  . Not on file  Social History Narrative   Lives at home alone.   Right-handed.   One adopted child.   One cup caffeine per day.     PHYSICAL EXAM   Vitals:   05/16/18 1023  BP: 132/72  Pulse: 84  Weight: 237 lb (107.5 kg)  Height: 5\' 11"  (1.803 m)    Not recorded      Body mass index is 33.05 kg/m.  PHYSICAL EXAMNIATION:  Gen: NAD, conversant, well nourised, obese, well groomed                     Cardiovascular: Regular rate rhythm, no peripheral edema, warm,  nontender. Eyes: Conjunctivae clear without exudates or hemorrhage Neck: Supple, no carotid bruits. Pulmonary: Clear to auscultation bilaterally   NEUROLOGICAL EXAM:  MENTAL STATUS: Speech:    Speech is normal; fluent and spontaneous with normal comprehension.  Cognition:     Orientation to time, place and person     Normal recent and remote memory     Normal Attention span and concentration     Normal Language,  naming, repeating,spontaneous speech     Fund of knowledge   CRANIAL NERVES: CN II: Visual fields are full to confrontation.. Pupils are round equal and briskly reactive to light. CN III, IV, VI: extraocular movement are normal. No ptosis. CN V: Facial sensation is intact to pinprick in all 3 divisions bilaterally. Corneal responses are intact.  CN VII: Face is symmetric with normal eye closure and smile. CN VIII: Hearing is normal to rubbing fingers CN IX, X: Palate elevates symmetrically. Phonation is normal. CN XI: Head turning and shoulder shrug are intact CN XII: Tongue is midline with normal movements and no atrophy.  MOTOR: Deformity of bilateral foot, mild bilateral intrinsic hand muscle atrophy, there is no significant bilateral upper lower extremity proximal muscle weakness, mild finger abduction grip weakness, no significant ankle dorsi flexion, toe weakness.  REFLEXES: Reflexes are 1 and symmetric at the biceps, triceps, knees, and absent at ankles. Plantar responses are flexor.  SENSORY: Length dependent decreased to light touch, pinprick, vibratory sensation to mid shin level,  COORDINATION: Rapid alternating movements and fine finger movements are intact. There is no dysmetria on finger-to-nose and heel-knee-shin.    GAIT/STANCE: He needs pushed up to get up from seated position, wide-based, cautious, mildly unsteady gait, positive Romberg signs  DIAGNOSTIC DATA (LABS, IMAGING, TESTING) - I reviewed patient records, labs, notes, testing and imaging  myself where available.   ASSESSMENT AND PLAN  Harry Burton is a 73 y.o. male   Gait abnormality Bilateral feet, and hands paresthesia  Multifactorial, this could due to deformity of his foot, peripheral neuropathy, need to rule out cervical spondylitic myelopathy, lumbar radiculopathy  EMG nerve conduction study  MRI of cervical, lumbar spine  Get Medical record from outside hospital    Marcial Pacas, M.D. Ph.D.  [facility] [address], [place], [place] [address] Ph: [phone] Fax: [phone]  HC:[id number], Joyice Faster, MD  Addendum: I reviewed EMG nerve conduction study from [facility] on May 01, 2014, by Dr. Sherlene Shams,   Left median sensory and motor responses were normal.  Left ulnar sensory and motor responses were normal.  Left superficial radial sensory responses were normal. Selected needle examination of left upper extremity muscles was normal.

## 2018-05-28 ENCOUNTER — Telehealth: Payer: Self-pay | Admitting: *Deleted

## 2018-05-28 NOTE — Telephone Encounter (Signed)
MRIs are scheduled on March 4th 2020, EMG/NCS on March 27

## 2018-05-28 NOTE — Telephone Encounter (Signed)
R/c ncv  and emg, have not r/c imaging cd or report.

## 2018-05-30 ENCOUNTER — Ambulatory Visit
Admission: RE | Admit: 2018-05-30 | Discharge: 2018-05-30 | Disposition: A | Discharge status: Home / Self Care | Payer: Medicare Other | Source: Ambulatory Visit | Attending: Neurology | Admitting: Neurology

## 2018-05-30 DIAGNOSIS — G3281 Cerebellar ataxia in diseases classified elsewhere: Secondary | ICD-10-CM

## 2018-06-01 ENCOUNTER — Telehealth: Payer: Self-pay | Admitting: Neurology

## 2018-06-01 NOTE — Telephone Encounter (Signed)
Left patient a detailed message, with results, on voicemail (ok per DPR).  Provided our number to call back with any questions.    Reminded him of his pending NCV/EMG appt.

## 2018-06-01 NOTE — Telephone Encounter (Signed)
Please call patient, MRI of cervical spine showed multilevel degenerative changes, moderate spinal stenosis at C3-4, variable degree of foraminal narrowing  Severe spinal stenosis at lumbar L4-5, multilevel foraminal narrowing,  Keep EMG nerve conduction study on March 27  I will review MRIs films with him at next follow-up visit  IMPRESSION:   MRI cervical spine (without) demonstrating; - C3-4: disc bulging and uncovertebral joint hypertrophy with moderate spinal stenosis and severe right and moderate left foraminal stenosis; no cord signal changes - C5-6: disc bulging and facet hypertrophy with mild spinal stenosis and severe biforaminal stenosis; no cord signal changes - C6-7: disc bulging and facet hypertrophy with moderate biforaminal stenosis  IMPRESSION:   MRI lumbar spine (without) demonstrating: - L4-5: disc bulging, facet hypertrophy with severe spinal stenosis, mild right and severe left foraminal stenosis  - L3-4: disc bulging and facet hypertrophy with moderate spinal stenosis and mild biforaminal stenosis  - L2-3: disc bulging and facet hypertrophy with mild biforaminal stenosis  - L5-S1: disc bulging and facet hypertrophy with left lateral recess stenosis

## 2018-06-04 DIAGNOSIS — H04123 Dry eye syndrome of bilateral lacrimal glands: Secondary | ICD-10-CM | POA: Diagnosis not present

## 2018-06-05 DIAGNOSIS — M150 Primary generalized (osteo)arthritis: Secondary | ICD-10-CM | POA: Diagnosis not present

## 2018-06-05 DIAGNOSIS — G629 Polyneuropathy, unspecified: Secondary | ICD-10-CM | POA: Diagnosis not present

## 2018-06-05 DIAGNOSIS — Z794 Long term (current) use of insulin: Secondary | ICD-10-CM | POA: Diagnosis not present

## 2018-06-05 DIAGNOSIS — E119 Type 2 diabetes mellitus without complications: Secondary | ICD-10-CM | POA: Diagnosis not present

## 2018-06-05 DIAGNOSIS — M0579 Rheumatoid arthritis with rheumatoid factor of multiple sites without organ or systems involvement: Secondary | ICD-10-CM | POA: Diagnosis not present

## 2018-06-05 DIAGNOSIS — Z6833 Body mass index (BMI) 33.0-33.9, adult: Secondary | ICD-10-CM | POA: Diagnosis not present

## 2018-06-05 DIAGNOSIS — E669 Obesity, unspecified: Secondary | ICD-10-CM | POA: Diagnosis not present

## 2018-06-18 DIAGNOSIS — M0579 Rheumatoid arthritis with rheumatoid factor of multiple sites without organ or systems involvement: Secondary | ICD-10-CM | POA: Diagnosis not present

## 2018-06-22 ENCOUNTER — Encounter: Payer: Medicare Other | Admitting: Neurology

## 2018-07-17 DIAGNOSIS — L821 Other seborrheic keratosis: Secondary | ICD-10-CM | POA: Diagnosis not present

## 2018-07-17 DIAGNOSIS — L820 Inflamed seborrheic keratosis: Secondary | ICD-10-CM | POA: Diagnosis not present

## 2018-07-17 DIAGNOSIS — L578 Other skin changes due to chronic exposure to nonionizing radiation: Secondary | ICD-10-CM | POA: Diagnosis not present

## 2018-07-17 DIAGNOSIS — L570 Actinic keratosis: Secondary | ICD-10-CM | POA: Diagnosis not present

## 2018-08-13 DIAGNOSIS — M0579 Rheumatoid arthritis with rheumatoid factor of multiple sites without organ or systems involvement: Secondary | ICD-10-CM | POA: Diagnosis not present

## 2018-09-03 DIAGNOSIS — E669 Obesity, unspecified: Secondary | ICD-10-CM | POA: Diagnosis not present

## 2018-09-03 DIAGNOSIS — M069 Rheumatoid arthritis, unspecified: Secondary | ICD-10-CM | POA: Diagnosis not present

## 2018-09-03 DIAGNOSIS — E1169 Type 2 diabetes mellitus with other specified complication: Secondary | ICD-10-CM | POA: Diagnosis not present

## 2018-09-03 DIAGNOSIS — Z6834 Body mass index (BMI) 34.0-34.9, adult: Secondary | ICD-10-CM | POA: Diagnosis not present

## 2018-09-03 DIAGNOSIS — Z79899 Other long term (current) drug therapy: Secondary | ICD-10-CM | POA: Diagnosis not present

## 2018-09-03 DIAGNOSIS — I10 Essential (primary) hypertension: Secondary | ICD-10-CM | POA: Diagnosis not present

## 2018-09-07 DIAGNOSIS — E119 Type 2 diabetes mellitus without complications: Secondary | ICD-10-CM | POA: Diagnosis not present

## 2018-09-07 DIAGNOSIS — Z794 Long term (current) use of insulin: Secondary | ICD-10-CM | POA: Diagnosis not present

## 2018-09-19 DIAGNOSIS — Z794 Long term (current) use of insulin: Secondary | ICD-10-CM | POA: Diagnosis not present

## 2018-09-19 DIAGNOSIS — I10 Essential (primary) hypertension: Secondary | ICD-10-CM | POA: Diagnosis not present

## 2018-09-19 DIAGNOSIS — M05711 Rheumatoid arthritis with rheumatoid factor of right shoulder without organ or systems involvement: Secondary | ICD-10-CM | POA: Diagnosis not present

## 2018-09-19 DIAGNOSIS — R001 Bradycardia, unspecified: Secondary | ICD-10-CM | POA: Diagnosis not present

## 2018-09-19 DIAGNOSIS — R202 Paresthesia of skin: Secondary | ICD-10-CM | POA: Diagnosis not present

## 2018-09-19 DIAGNOSIS — E0800 Diabetes mellitus due to underlying condition with hyperosmolarity without nonketotic hyperglycemic-hyperosmolar coma (NKHHC): Secondary | ICD-10-CM | POA: Diagnosis not present

## 2018-09-19 DIAGNOSIS — E119 Type 2 diabetes mellitus without complications: Secondary | ICD-10-CM | POA: Diagnosis not present

## 2018-09-24 ENCOUNTER — Ambulatory Visit (INDEPENDENT_AMBULATORY_CARE_PROVIDER_SITE_OTHER): Payer: Medicare Other | Admitting: Neurology

## 2018-09-24 ENCOUNTER — Other Ambulatory Visit: Payer: Self-pay

## 2018-09-24 DIAGNOSIS — R202 Paresthesia of skin: Secondary | ICD-10-CM | POA: Diagnosis not present

## 2018-09-24 DIAGNOSIS — G5622 Lesion of ulnar nerve, left upper limb: Secondary | ICD-10-CM | POA: Diagnosis not present

## 2018-09-24 DIAGNOSIS — R269 Unspecified abnormalities of gait and mobility: Secondary | ICD-10-CM

## 2018-09-24 DIAGNOSIS — R531 Weakness: Secondary | ICD-10-CM

## 2018-09-24 DIAGNOSIS — M48061 Spinal stenosis, lumbar region without neurogenic claudication: Secondary | ICD-10-CM

## 2018-09-24 NOTE — Progress Notes (Signed)
PATIENT: Harry Burton DOB: 1945-06-30  No chief complaint on file.    HISTORICAL  Harry Burton is a 73 years old male, seen in request by his neurosurgeon Dr Emelda Brothers A, for evaluation of bilateral hands paresthesia initial evaluation was on May 16, 2018.  I have reviewed and summarized the referring note from the referring physician.  He had past medical history of hypertension, diabetes, rheumatoid arthritis since age 80  He had gradual onset deformity of left foot, used to enjoying hiking, then developed left foot pain, difficulty walking required reconstruction surgery of left foot in 2016, after that, he began to notice left foot numbness, denies significant pain, gradually getting worse, especially after he bear weight for a while, he has dense left foot numbness, give out underneath him, he has to rest for a while.  He has history of diabetes since 2015, diabetes under good control, Accu-Chek range was in 90-120.  In 2012, he had an episode of radiating pain from left shoulder to left arm, left hand, lasting for 6 weeks, gradually resolved, had recurrent episode in 2014, considered due to his degenerative cervical spine disease, symptoms improved with neck contraction, and epidural shock, he reported that with his neck stretch, contraction, his left arm symptoms were totally disappear.  In 2016, he began to have recurrent left arm symptoms again, numbness tingling, weakness, difficulty opening the jar, reported abnormal EMG nerve conduction study from outside hospital, he eventually had left median neuropathy and ulnar neuropathy decompression surgery in 2019, which failed to improve his symptoms at all.  He reported previous MRI of cervical, and the lumbar spine showed multilevel degenerative changes, but not a surgical candidate.  He moved from [place] to [place] in August 2019, previous EMG nerve conduction study and MRI was all done at [place].  Per record  MRI of lumbar from September 2018 severe bilateral moderate foraminal stenosis L3-4, severe stenosis at L4-5, also has neck and arm symptoms.  UPDATE September 24 2018: Patient return for electrodiagnostic study today, which showed evidence of chronic bilateral lumbosacral radiculopathy, mainly involving bilateral L4, L5 myotomes, more than S1 myotomes.  In addition, there is also evidence of mild axonal sensorimotor polyneuropathy.  There is also evidence of left ulnar neuropathy  Personally reviewed MRI of cervical spine in March 2020, degenerative disc disease, most severe at C3 and 4, with spinal stenosis, severe right, moderate left foraminal stenosis, C5-6, severe bilateral foraminal stenosis, mild spinal stenosis, no cord signal changes. MRI of lumbar spine: Severe spinal stenosis L4-5, mild right, severe left foraminal stenosis.  L3 and 4, moderate spinal stenosis, mild bilateral foraminal stenosis  He complains of worsening gait abnormality, bilateral feet paresthesia, also has left hand paresthesia mainly involving left third and fourth fingers  REVIEW OF SYSTEMS: Full 14 system review of systems performed and notable only for as above All other review of systems were negative.  ALLERGIES: Allergies  Allergen Reactions  . Penicillin G Rash    HOME MEDICATIONS: Current Outpatient Medications  Medication Sig Dispense Refill  . ASPIRIN 81 PO Take 81 mg by mouth daily.    Marland Kitchen BIOTIN PO Take 5,000 mcg by mouth daily.    Marland Kitchen CALCIUM PO Take 5,000 mg by mouth daily.    . Cyanocobalamin (B-12 PO) Take 5,000 mcg by mouth daily.    . folic acid (FOLVITE) 1 MG tablet Take by mouth.    . inFLIXimab (REMICADE IV) Inject into the vein every 8 (eight) weeks.    Marland Kitchen  LEVEMIR FLEXTOUCH 100 UNIT/ML Pen INJECT 60 UNITS UNDER THE SKIN QD    . lisinopril (PRINIVIL,ZESTRIL) 5 MG tablet Take 5 mg by mouth daily.    . metFORMIN (GLUCOPHAGE-XR) 500 MG 24 hr tablet TK 2 TS PO BID    . Methotrexate, PF, 10  MG/0.4ML SOAJ Inject into the skin.    . ranitidine (ZANTAC) 150 MG tablet Take by mouth.    Marland Kitchen VITAMIN D PO Take 5,000 Units by mouth daily.     No current facility-administered medications for this visit.     PAST MEDICAL HISTORY: Past Medical History:  Diagnosis Date  . Diabetes ([place])   . Hypertension   . Rheumatoid aortitis   . Skin cancer    basal cell    PAST SURGICAL HISTORY: Past Surgical History:  Procedure Laterality Date  . CARPAL TUNNEL RELEASE Left   . ELBOW SURGERY Left   . FOOT SURGERY Bilateral     FAMILY HISTORY: Family History  Problem Relation Age of Onset  . Stroke Mother   . Rheum arthritis Father   . Cancer Father        unsure of type    SOCIAL HISTORY: Social History   Socioeconomic History  . Marital status: Single    Spouse name: Not on file  . Number of children: 1  . Years of education: college  . Highest education level: Bachelor's degree (e.g., BA, AB, BS)  Occupational History  . Occupation: Retired   Scientific laboratory technician  . Financial resource strain: Not on file  . Food insecurity    Worry: Not on file    Inability: Not on file  . Transportation needs    Medical: Not on file    Non-medical: Not on file  Tobacco Use  . Smoking status: Never Smoker  . Smokeless tobacco: Never Used  Substance and Sexual Activity  . Alcohol use: Not Currently  . Drug use: Never  . Sexual activity: Not on file  Lifestyle  . Physical activity    Days per week: Not on file    Minutes per session: Not on file  . Stress: Not on file  Relationships  . Social Herbalist on phone: Not on file    Gets together: Not on file    Attends religious service: Not on file    Active member of club or organization: Not on file    Attends meetings of clubs or organizations: Not on file    Relationship status: Not on file  . Intimate partner violence    Fear of current or ex partner: Not on file    Emotionally abused: Not on file    Physically  abused: Not on file    Forced sexual activity: Not on file  Other Topics Concern  . Not on file  Social History Narrative   Lives at home alone.   Right-handed.   One adopted child.   One cup caffeine per day.     PHYSICAL EXAM   There were no vitals filed for this visit.  Not recorded      There is no height or weight on file to calculate BMI.  PHYSICAL EXAMNIATION:  Gen: NAD, conversant, well nourised, obese, well groomed                     Cardiovascular: Regular rate rhythm, no peripheral edema, warm, nontender. Eyes: Conjunctivae clear without exudates or hemorrhage Neck: Supple, no carotid bruits. Pulmonary: Clear  to auscultation bilaterally   NEUROLOGICAL EXAM:  MENTAL STATUS: Speech:    Speech is normal; fluent and spontaneous with normal comprehension.  Cognition:     Orientation to time, place and person     Normal recent and remote memory     Normal Attention span and concentration     Normal Language, naming, repeating,spontaneous speech     Fund of knowledge   CRANIAL NERVES: CN II: Visual fields are full to confrontation.. Pupils are round equal and briskly reactive to light. CN III, IV, VI: extraocular movement are normal. No ptosis. CN V: Facial sensation is intact to pinprick in all 3 divisions bilaterally. Corneal responses are intact.  CN VII: Face is symmetric with normal eye closure and smile. CN VIII: Hearing is normal to rubbing fingers CN IX, X: Palate elevates symmetrically. Phonation is normal. CN XI: Head turning and shoulder shrug are intact CN XII: Tongue is midline with normal movements and no atrophy.  MOTOR: Deformity of bilateral foot, evidence of previous bilateral ankle surgery fusion, mild left intrinsic hand muscle atrophy, there is no significant bilateral upper lower extremity proximal muscle weakness, mild left finger abduction and left hand grip weakness, no significant ankle dorsi flexion, toe weakness.  REFLEXES:  Reflexes are 1 and symmetric at the biceps, triceps, knees, and absent at ankles. Plantar responses are flexor.  SENSORY: Length dependent decreased to light touch, pinprick, vibratory sensation to mid shin level,  COORDINATION: Rapid alternating movements and fine finger movements are intact. There is no dysmetria on finger-to-nose and heel-knee-shin.    GAIT/STANCE: He needs pushed up to get up from seated position, wide-based, cautious, mildly unsteady gait, positive Romberg signs  DIAGNOSTIC DATA (LABS, IMAGING, TESTING) - I reviewed patient records, labs, notes, testing and imaging myself where available.   ASSESSMENT AND PLAN  Harry Burton is a 73 y.o. male   Gait abnormality Bilateral lumbosacral radiculopathy, severe spinal stenosis L4-5, L3 and 4 Left hand paresthesia  Mild left ulnar neuropathy  Evidence of multiple cervical degenerative changes, foraminal narrowing, C3-4, severe on the right side, moderate on the left side, severe bilateral foraminal narrowing at C5-6  Severe lumbar stenosis at L4-5, will refer him to neurosurgeon    Marcial Pacas, M.D. Ph.D.  [facility] [address] [place], [place] [address] Ph: [phone] Fax: [phone]  HQ:[id number], Joyice Faster, MD  Addendum: I reviewed EMG nerve conduction study from [facility] on May 01, 2014, by Dr. Sherlene Shams,   Left median sensory and motor responses were normal.  Left ulnar sensory and motor responses were normal.  Left superficial radial sensory responses were normal. Selected needle examination of left upper extremity muscles was normal.

## 2018-09-24 NOTE — Procedures (Signed)
Full Name: Harry Burton Gender: Male MRN #: [id number] Date of Birth: 1945-05-16    Visit Date: 09/24/2018 13:35 Age: 73 Years 41 Months Old Examining Physician: Marcial Pacas, MD  Referring Physician: Marcial Pacas, MD History: 73 year old male, with history of low back pain, bilateral lower extremity paresthesia, weakness, gradual onset gait abnormality, slow worsening neck pain, radiating pain to left shoulder, left hand paresthesia mainly involving left third and fourth finger  Summary of the test:  Nerve conduction study: Bilateral sural and  superfacial peroneal sensory responses were absent.  Left ulnar sensory response was absent.  Left radial sensory response showed greatly decreased to snap amplitude, normal peak latency.  After radial sensory response showed normal peak latency, with moderately decreased SNAP amplitude.    Bilateral tibial motor responses showed severely decreased the C map amplitude, mildly slow conduction velocity, normal distal latency. Left median, ulnar, bilateral peroneal to EDB motor responses were normal.  Electromyography: Selective needle examinations were performed at bilateral lower extremity muscles, bilateral lumbosacral paraspinal muscles, left upper extremity muscles, left cervical paraspinal muscles.  There was active denervation, chronic neuropathic changes involving bilateral L4, L5, more than S1 myotomes.  There was mild increased insertional activity at bilateral lumbosacral paraspinals with complex enlarged motor unit potential.  There was evidence of mild chronic neuropathic changes involving left ulnar innervated muscles, left first dorsal interossei, abductor digiti minimi, milder degree at left flexor carpi ulnaris.  Conclusion: This is an abnormal study.  There is electrodiagnostic evidence of chronic bilateral lumbar sacral radiculopathy, mainly involving bilateral L4, L5 more than S1 myotomes.  In addition, there is evidence of  mild length dependent axonal sensorimotor polyneuropathy.  There is also evidence of mild left ulnar neuropathy, above the take off the left flexor carpi ulnaris.    ------------------------------- Marcial Pacas, M.D.Ph.D.  [facility] [place], [place] [address] Tel: [phone] Fax: [phone]        [facility]    Nerve / Sites Muscle Latency Ref. Amplitude Ref. Rel Amp Segments Distance Velocity Ref. Area    ms ms mV mV %  cm m/s m/s mVms  L Median - APB     Wrist APB 3.5 ?4.4 5.2 ?4.0 100 Wrist - APB 7   17.9     Upper arm APB 7.8  5.1  98.4 Upper arm - Wrist 23 54 ?49 17.8  L Ulnar - ADM     Wrist ADM 2.8 ?3.3 7.0 ?6.0 100 Wrist - ADM 7   19.4     B.Elbow ADM 6.4  6.0  85.7 B.Elbow - Wrist 19 53 ?49 18.2     A.Elbow ADM 8.4  5.4  90.2 A.Elbow - B.Elbow 10 49 ?49 17.3         A.Elbow - Wrist      L Peroneal - EDB     Ankle EDB 5.3 ?6.5 3.0 ?2.0 100 Ankle - EDB 9   10.0     Fib head EDB 13.0  2.6  88.6 Fib head - Ankle 29 38 ?44 10.1     Pop fossa EDB 15.7  2.5  94.6 Pop fossa - Fib head 10 37 ?44 9.9         Pop fossa - Ankle      R Peroneal - EDB     Ankle EDB 5.7 ?6.5 2.0 ?2.0 100 Ankle - EDB 9   5.5     Fib head EDB 12.8  1.6  77.2 Fib head - Ankle 28 39 ?44 4.9     Pop fossa EDB 15.4  1.5  95.7 Pop fossa - Fib head 10 39 ?44 4.8         Pop fossa - Ankle      L Tibial - AH     Ankle AH 4.9 ?5.8 0.4 ?4.0 100 Ankle - AH 9   1.3     Pop fossa AH 16.3  0.4  107 Pop fossa - Ankle 40 35 ?41 2.2  R Tibial - AH     Ankle AH 5.2 ?5.8 0.2 ?4.0 100 Ankle - AH 9   1.5     Pop fossa AH 16.6  0.2  91.5 Pop fossa - Ankle 40 35 ?41 1.0                  SNC    Nerve / Sites Rec. Site Peak Lat Ref.  Amp Ref. Segments Distance    ms ms V V  cm  L Radial - Anatomical snuff box (Forearm)     Forearm Wrist 2.7 ?2.9 8 ?15 Forearm - Wrist 10  L Sural - Ankle (Calf)     Calf Ankle NR ?4.4 NR ?6 Calf - Ankle 14  R Sural - Ankle (Calf)     Calf Ankle NR ?4.4 NR  ?6 Calf - Ankle 14  L Superficial peroneal - Ankle     Lat leg Ankle NR ?4.4 NR ?6 Lat leg - Ankle 14  R Superficial peroneal - Ankle     Lat leg Ankle NR ?4.4 NR ?6 Lat leg - Ankle 14  L Median - Orthodromic (Dig II, Mid palm)     Dig II Wrist 3.1 ?3.4 3 ?10 Dig II - Wrist 13  L Ulnar - Orthodromic, (Dig V, Mid palm)     Dig V Wrist NR ?3.1 NR ?5 Dig V - Wrist 56                   F  Wave    Nerve F Lat Ref.   ms ms  L Tibial - AH 63.2 ?56.0  R Tibial - AH 69.9 ?56.0  L Ulnar - ADM 26.8 ?32.0           EMG       EMG Summary Table    Spontaneous MUAP Recruitment  Muscle IA Fib PSW Fasc Other Amp Dur. Poly Pattern  L. First dorsal interosseous Increased None None None _______ Normal Normal Normal Reduced  L. Abductor digiti minimi (manus) Increased None None None _______ Normal Normal Normal Reduced  L. Flexor carpi ulnaris Increased None None None _______ Normal Normal Normal Reduced  L. Pronator teres Normal None None None _______ Normal Normal Normal Normal  L. Biceps brachii Normal None None None _______ Normal Normal Normal Normal  L. Deltoid Normal None None None _______ Normal Normal Normal Normal  L. Cervical paraspinals Normal None None None _______ Normal Normal Normal Normal  L. Tibialis anterior Increased 2+ None None _______ Increased Increased 1+ Reduced  L. Tibialis posterior Increased None None None _______ Increased Increased Normal Reduced  L. Peroneus longus Increased None None None _______ Increased Increased Normal Reduced  L. Vastus lateralis Normal None None None _______ Normal Normal Normal Reduced  R. Tibialis anterior Increased 1+ None None _______ Increased Increased 1+ Reduced  R. Tibialis posterior Increased None None None _______ Normal Normal Normal Reduced  R. Vastus lateralis  Normal None None None _______ Normal Normal Normal Reduced  R. Gastrocnemius (Medial head) Normal None None None _______ Normal Normal Normal Reduced  L. Gastrocnemius  (Medial head) Increased None None None _______ Normal Normal Normal Reduced  R. Lumbar paraspinals (mid) Increased 1+ None None _______ Increased Increased 1+ Normal  R. Lumbar paraspinals (low) Increased None None None _______ Increased Increased 1+ Normal  L. Lumbar paraspinals (mid) Increased None None None _______ Increased Increased Normal Normal  L. Lumbar paraspinals (low) Increased None None None _______ Increased Increased 1+ Normal

## 2018-10-05 DIAGNOSIS — M0579 Rheumatoid arthritis with rheumatoid factor of multiple sites without organ or systems involvement: Secondary | ICD-10-CM | POA: Diagnosis not present

## 2018-10-05 DIAGNOSIS — M150 Primary generalized (osteo)arthritis: Secondary | ICD-10-CM | POA: Diagnosis not present

## 2018-10-05 DIAGNOSIS — Z794 Long term (current) use of insulin: Secondary | ICD-10-CM | POA: Diagnosis not present

## 2018-10-05 DIAGNOSIS — E119 Type 2 diabetes mellitus without complications: Secondary | ICD-10-CM | POA: Diagnosis not present

## 2018-10-05 DIAGNOSIS — G629 Polyneuropathy, unspecified: Secondary | ICD-10-CM | POA: Diagnosis not present

## 2018-10-05 DIAGNOSIS — Z6832 Body mass index (BMI) 32.0-32.9, adult: Secondary | ICD-10-CM | POA: Diagnosis not present

## 2018-10-05 DIAGNOSIS — E669 Obesity, unspecified: Secondary | ICD-10-CM | POA: Diagnosis not present

## 2018-10-08 DIAGNOSIS — M0579 Rheumatoid arthritis with rheumatoid factor of multiple sites without organ or systems involvement: Secondary | ICD-10-CM | POA: Diagnosis not present

## 2018-10-25 DIAGNOSIS — Z6833 Body mass index (BMI) 33.0-33.9, adult: Secondary | ICD-10-CM | POA: Diagnosis not present

## 2018-10-25 DIAGNOSIS — I10 Essential (primary) hypertension: Secondary | ICD-10-CM | POA: Diagnosis not present

## 2018-10-25 DIAGNOSIS — M5412 Radiculopathy, cervical region: Secondary | ICD-10-CM | POA: Diagnosis not present

## 2018-12-10 DIAGNOSIS — M0579 Rheumatoid arthritis with rheumatoid factor of multiple sites without organ or systems involvement: Secondary | ICD-10-CM | POA: Diagnosis not present

## 2018-12-10 DIAGNOSIS — Z79899 Other long term (current) drug therapy: Secondary | ICD-10-CM | POA: Diagnosis not present

## 2019-01-08 DIAGNOSIS — E119 Type 2 diabetes mellitus without complications: Secondary | ICD-10-CM | POA: Diagnosis not present

## 2019-01-08 DIAGNOSIS — Z794 Long term (current) use of insulin: Secondary | ICD-10-CM | POA: Diagnosis not present

## 2019-02-04 DIAGNOSIS — M0579 Rheumatoid arthritis with rheumatoid factor of multiple sites without organ or systems involvement: Secondary | ICD-10-CM | POA: Diagnosis not present

## 2019-02-04 DIAGNOSIS — Z79899 Other long term (current) drug therapy: Secondary | ICD-10-CM | POA: Diagnosis not present

## 2019-02-12 DIAGNOSIS — Z23 Encounter for immunization: Secondary | ICD-10-CM | POA: Diagnosis not present

## 2019-05-01 DIAGNOSIS — E1142 Type 2 diabetes mellitus with diabetic polyneuropathy: Secondary | ICD-10-CM | POA: Diagnosis not present

## 2019-05-01 DIAGNOSIS — T8484XA Pain due to internal orthopedic prosthetic devices, implants and grafts, initial encounter: Secondary | ICD-10-CM | POA: Diagnosis not present

## 2019-05-01 DIAGNOSIS — M79671 Pain in right foot: Secondary | ICD-10-CM | POA: Diagnosis not present

## 2019-05-01 DIAGNOSIS — M79672 Pain in left foot: Secondary | ICD-10-CM | POA: Diagnosis not present

## 2019-05-01 DIAGNOSIS — M2042 Other hammer toe(s) (acquired), left foot: Secondary | ICD-10-CM | POA: Diagnosis not present

## 2019-05-09 DIAGNOSIS — I10 Essential (primary) hypertension: Secondary | ICD-10-CM | POA: Diagnosis not present

## 2019-05-09 DIAGNOSIS — M5412 Radiculopathy, cervical region: Secondary | ICD-10-CM | POA: Diagnosis not present

## 2019-05-10 DIAGNOSIS — Z23 Encounter for immunization: Secondary | ICD-10-CM | POA: Diagnosis not present

## 2019-05-14 DIAGNOSIS — Z794 Long term (current) use of insulin: Secondary | ICD-10-CM | POA: Diagnosis not present

## 2019-05-14 DIAGNOSIS — E1142 Type 2 diabetes mellitus with diabetic polyneuropathy: Secondary | ICD-10-CM | POA: Diagnosis not present

## 2019-05-27 DIAGNOSIS — M0579 Rheumatoid arthritis with rheumatoid factor of multiple sites without organ or systems involvement: Secondary | ICD-10-CM | POA: Diagnosis not present

## 2019-06-07 DIAGNOSIS — Z23 Encounter for immunization: Secondary | ICD-10-CM | POA: Diagnosis not present

## 2019-06-13 DIAGNOSIS — M5412 Radiculopathy, cervical region: Secondary | ICD-10-CM | POA: Diagnosis not present

## 2019-07-04 DIAGNOSIS — Z6835 Body mass index (BMI) 35.0-35.9, adult: Secondary | ICD-10-CM | POA: Diagnosis not present

## 2019-07-04 DIAGNOSIS — M5412 Radiculopathy, cervical region: Secondary | ICD-10-CM | POA: Diagnosis not present

## 2019-07-04 DIAGNOSIS — I10 Essential (primary) hypertension: Secondary | ICD-10-CM | POA: Diagnosis not present

## 2019-07-22 DIAGNOSIS — M0579 Rheumatoid arthritis with rheumatoid factor of multiple sites without organ or systems involvement: Secondary | ICD-10-CM | POA: Diagnosis not present

## 2019-09-06 DIAGNOSIS — L218 Other seborrheic dermatitis: Secondary | ICD-10-CM | POA: Diagnosis not present

## 2019-09-06 DIAGNOSIS — L821 Other seborrheic keratosis: Secondary | ICD-10-CM | POA: Diagnosis not present

## 2019-09-06 DIAGNOSIS — D1801 Hemangioma of skin and subcutaneous tissue: Secondary | ICD-10-CM | POA: Diagnosis not present

## 2019-09-06 DIAGNOSIS — Z85828 Personal history of other malignant neoplasm of skin: Secondary | ICD-10-CM | POA: Diagnosis not present

## 2019-09-06 DIAGNOSIS — L298 Other pruritus: Secondary | ICD-10-CM | POA: Diagnosis not present

## 2019-09-06 DIAGNOSIS — D225 Melanocytic nevi of trunk: Secondary | ICD-10-CM | POA: Diagnosis not present

## 2019-09-06 DIAGNOSIS — L814 Other melanin hyperpigmentation: Secondary | ICD-10-CM | POA: Diagnosis not present

## 2019-09-11 DIAGNOSIS — E1142 Type 2 diabetes mellitus with diabetic polyneuropathy: Secondary | ICD-10-CM | POA: Diagnosis not present

## 2019-09-11 DIAGNOSIS — Z794 Long term (current) use of insulin: Secondary | ICD-10-CM | POA: Diagnosis not present

## 2019-09-16 DIAGNOSIS — M0579 Rheumatoid arthritis with rheumatoid factor of multiple sites without organ or systems involvement: Secondary | ICD-10-CM | POA: Diagnosis not present

## 2019-10-11 DIAGNOSIS — M859 Disorder of bone density and structure, unspecified: Secondary | ICD-10-CM | POA: Diagnosis not present

## 2019-10-11 DIAGNOSIS — Z1331 Encounter for screening for depression: Secondary | ICD-10-CM | POA: Diagnosis not present

## 2019-10-11 DIAGNOSIS — Z9181 History of falling: Secondary | ICD-10-CM | POA: Diagnosis not present

## 2019-10-11 DIAGNOSIS — M069 Rheumatoid arthritis, unspecified: Secondary | ICD-10-CM | POA: Diagnosis not present

## 2019-10-11 DIAGNOSIS — Z125 Encounter for screening for malignant neoplasm of prostate: Secondary | ICD-10-CM | POA: Diagnosis not present

## 2019-10-11 DIAGNOSIS — E1169 Type 2 diabetes mellitus with other specified complication: Secondary | ICD-10-CM | POA: Diagnosis not present

## 2019-10-11 DIAGNOSIS — Z0000 Encounter for general adult medical examination without abnormal findings: Secondary | ICD-10-CM | POA: Diagnosis not present

## 2019-10-11 DIAGNOSIS — M8589 Other specified disorders of bone density and structure, multiple sites: Secondary | ICD-10-CM | POA: Diagnosis not present

## 2019-11-11 DIAGNOSIS — M0579 Rheumatoid arthritis with rheumatoid factor of multiple sites without organ or systems involvement: Secondary | ICD-10-CM | POA: Diagnosis not present

## 2020-01-06 DIAGNOSIS — M0579 Rheumatoid arthritis with rheumatoid factor of multiple sites without organ or systems involvement: Secondary | ICD-10-CM | POA: Diagnosis not present

## 2020-01-14 DIAGNOSIS — Z794 Long term (current) use of insulin: Secondary | ICD-10-CM | POA: Diagnosis not present

## 2020-01-14 DIAGNOSIS — E1142 Type 2 diabetes mellitus with diabetic polyneuropathy: Secondary | ICD-10-CM | POA: Diagnosis not present

## 2020-01-31 DIAGNOSIS — Z23 Encounter for immunization: Secondary | ICD-10-CM | POA: Diagnosis not present

## 2020-02-10 DIAGNOSIS — Z23 Encounter for immunization: Secondary | ICD-10-CM | POA: Diagnosis not present

## 2020-03-02 DIAGNOSIS — M0579 Rheumatoid arthritis with rheumatoid factor of multiple sites without organ or systems involvement: Secondary | ICD-10-CM | POA: Diagnosis not present

## 2020-03-02 DIAGNOSIS — Z79899 Other long term (current) drug therapy: Secondary | ICD-10-CM | POA: Diagnosis not present

## 2020-04-08 DIAGNOSIS — Z6836 Body mass index (BMI) 36.0-36.9, adult: Secondary | ICD-10-CM | POA: Diagnosis not present

## 2020-04-08 DIAGNOSIS — M150 Primary generalized (osteo)arthritis: Secondary | ICD-10-CM | POA: Diagnosis not present

## 2020-04-08 DIAGNOSIS — E669 Obesity, unspecified: Secondary | ICD-10-CM | POA: Diagnosis not present

## 2020-04-08 DIAGNOSIS — G629 Polyneuropathy, unspecified: Secondary | ICD-10-CM | POA: Diagnosis not present

## 2020-04-08 DIAGNOSIS — Z794 Long term (current) use of insulin: Secondary | ICD-10-CM | POA: Diagnosis not present

## 2020-04-08 DIAGNOSIS — M0579 Rheumatoid arthritis with rheumatoid factor of multiple sites without organ or systems involvement: Secondary | ICD-10-CM | POA: Diagnosis not present

## 2020-04-08 DIAGNOSIS — E119 Type 2 diabetes mellitus without complications: Secondary | ICD-10-CM | POA: Diagnosis not present

## 2020-04-27 DIAGNOSIS — Z79899 Other long term (current) drug therapy: Secondary | ICD-10-CM | POA: Diagnosis not present

## 2020-04-27 DIAGNOSIS — M0579 Rheumatoid arthritis with rheumatoid factor of multiple sites without organ or systems involvement: Secondary | ICD-10-CM | POA: Diagnosis not present

## 2020-05-19 DIAGNOSIS — Z794 Long term (current) use of insulin: Secondary | ICD-10-CM | POA: Diagnosis not present

## 2020-05-19 DIAGNOSIS — E1142 Type 2 diabetes mellitus with diabetic polyneuropathy: Secondary | ICD-10-CM | POA: Diagnosis not present

## 2020-05-27 DIAGNOSIS — R110 Nausea: Secondary | ICD-10-CM | POA: Diagnosis not present

## 2020-05-27 DIAGNOSIS — J189 Pneumonia, unspecified organism: Secondary | ICD-10-CM | POA: Diagnosis not present

## 2020-05-27 DIAGNOSIS — Z20828 Contact with and (suspected) exposure to other viral communicable diseases: Secondary | ICD-10-CM | POA: Diagnosis not present

## 2020-05-27 DIAGNOSIS — R0602 Shortness of breath: Secondary | ICD-10-CM | POA: Diagnosis not present

## 2020-06-01 DIAGNOSIS — R059 Cough, unspecified: Secondary | ICD-10-CM | POA: Diagnosis not present

## 2020-06-01 DIAGNOSIS — I2510 Atherosclerotic heart disease of native coronary artery without angina pectoris: Secondary | ICD-10-CM | POA: Diagnosis not present

## 2020-06-01 DIAGNOSIS — R911 Solitary pulmonary nodule: Secondary | ICD-10-CM | POA: Diagnosis not present

## 2020-06-01 DIAGNOSIS — R918 Other nonspecific abnormal finding of lung field: Secondary | ICD-10-CM | POA: Diagnosis not present

## 2020-06-01 DIAGNOSIS — Z8701 Personal history of pneumonia (recurrent): Secondary | ICD-10-CM | POA: Diagnosis not present

## 2020-06-03 DIAGNOSIS — J189 Pneumonia, unspecified organism: Secondary | ICD-10-CM | POA: Diagnosis not present

## 2020-06-03 DIAGNOSIS — R918 Other nonspecific abnormal finding of lung field: Secondary | ICD-10-CM | POA: Diagnosis not present

## 2020-06-03 DIAGNOSIS — J449 Chronic obstructive pulmonary disease, unspecified: Secondary | ICD-10-CM | POA: Diagnosis not present

## 2020-06-03 DIAGNOSIS — Z6836 Body mass index (BMI) 36.0-36.9, adult: Secondary | ICD-10-CM | POA: Diagnosis not present

## 2020-06-29 DIAGNOSIS — M0579 Rheumatoid arthritis with rheumatoid factor of multiple sites without organ or systems involvement: Secondary | ICD-10-CM | POA: Diagnosis not present

## 2020-06-29 DIAGNOSIS — Z79899 Other long term (current) drug therapy: Secondary | ICD-10-CM | POA: Diagnosis not present

## 2020-07-06 DIAGNOSIS — J449 Chronic obstructive pulmonary disease, unspecified: Secondary | ICD-10-CM | POA: Diagnosis not present

## 2020-07-06 DIAGNOSIS — E1169 Type 2 diabetes mellitus with other specified complication: Secondary | ICD-10-CM | POA: Diagnosis not present

## 2020-07-06 DIAGNOSIS — J189 Pneumonia, unspecified organism: Secondary | ICD-10-CM | POA: Diagnosis not present

## 2020-07-13 ENCOUNTER — Institutional Professional Consult (permissible substitution): Payer: Medicare Other | Admitting: Pulmonary Disease

## 2020-07-16 ENCOUNTER — Institutional Professional Consult (permissible substitution): Payer: Medicare Other | Admitting: Pulmonary Disease

## 2020-07-24 ENCOUNTER — Ambulatory Visit (INDEPENDENT_AMBULATORY_CARE_PROVIDER_SITE_OTHER): Payer: Medicare Other | Admitting: Emergency Medicine

## 2020-07-24 ENCOUNTER — Other Ambulatory Visit: Payer: Self-pay

## 2020-07-24 ENCOUNTER — Encounter: Payer: Self-pay | Admitting: Emergency Medicine

## 2020-07-24 VITALS — BP 130/78 | HR 57 | Temp 97.8°F | Ht 71.0 in | Wt 265.2 lb

## 2020-07-24 DIAGNOSIS — R059 Cough, unspecified: Secondary | ICD-10-CM | POA: Diagnosis not present

## 2020-07-24 DIAGNOSIS — R911 Solitary pulmonary nodule: Secondary | ICD-10-CM

## 2020-07-24 DIAGNOSIS — R918 Other nonspecific abnormal finding of lung field: Secondary | ICD-10-CM

## 2020-07-24 NOTE — Assessment & Plan Note (Signed)
Based on history suspicious for possible asthma plus superimposed upper airway irritation.  We will plan for pulmonary function testing.  Hold off on scheduling bronchodilator therapy for now.  He will keep albuterol available to use as needed.  We will need to troubleshoot possible contributors including GERD, rhinitis going forward.

## 2020-07-24 NOTE — Assessment & Plan Note (Signed)
Bilateral solid pulmonary nodules found on CT chest.  Patient believes that he may have had nodule seen on remote imaging as well but cannot remember the exact details.  I suspect that these are rheumatoid nodules given his history.  Certainly need to be followed to ensure no evidence of malignancy although he is a low risk patient.  Also need to consider possible opportunistic infection since he is on methotrexate and Remicade.  No indication for bronchoscopy currently.  We will follow for interval stability in 6 months

## 2020-07-24 NOTE — Progress Notes (Signed)
Subjective:    Patient ID: Harry Burton, male    DOB: [birth year], 75 y.o.   MRN: [id number]  HPI 75 year old never smoker with a history of obstructive sleep apnea, longstanding rheumatoid arthritis on methotrexate and Remicade, hypertension, diabetes, basal cell skin CA, allergic rhinitis. He has been told that he has possible exercise induced asthma in the past - used albuterol, usually in cold months. Was started on Symbicort about 5 years ago, unsure that it has done much for him.   He was seen with cough in late Feb into March, was seen by PCP.  Diagnosed with a possible community-acquired pneumonia 05/27/2020 and treated with levofloxacin.  Chest x-ray from 05/27/2020 reviewed, showed ectatic thoracic aorta, normal heart size, question left midlung zone pulmonary nodule 8-9 mm without any evidence of infiltrate.  CT scan of his chest performed on 06/01/2020 reviewed by me showed no infiltrates, no mediastinal or axillary, hilar lymphadenopathy, 7 x 6 mm subpleural right lower lobe pulmonary nodule, 8 x 11 mm pleural-based left lower lobe pulmonary nodule.   Review of Systems As per HPI  Past Medical History:  Diagnosis Date  . Diabetes ([place])   . Hypertension   . Rheumatoid aortitis   . Skin cancer    basal cell     Family History  Problem Relation Age of Onset  . Stroke Mother   . Rheum arthritis Father   . Cancer Father        unsure of type    No lung CA immediate family, father did have CA of some kind.   Social History   Socioeconomic History  . Marital status: Single    Spouse name: Not on file  . Number of children: 1  . Years of education: college  . Highest education level: Bachelor's degree (e.g., BA, AB, BS)  Occupational History  . Occupation: Retired   Tobacco Use  . Smoking status: Never Smoker  . Smokeless tobacco: Never Used  Substance and Sexual Activity  . Alcohol use: Not Currently  . Drug use: Never  . Sexual activity: Not on file  Other Topics  Concern  . Not on file  Social History Narrative   Lives at home alone.   Right-handed.   One adopted child.   One cup caffeine per day.   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 Penicillin G Rash     Outpatient Medications Prior to Visit  Medication Sig Dispense Refill  . albuterol (VENTOLIN HFA) 108 (90 Base) MCG/ACT inhaler Inhale 2 puffs into the lungs every 6 (six) hours as needed.    . ASPIRIN 81 PO Take 81 mg by mouth daily.    . Cholecalciferol (VITAMIN D3) 1.25 MG (50000 UT) CAPS Take 1 capsule by mouth daily.    . Cyanocobalamin (B-12 PO) Take 5,000 mcg by mouth daily.    . famotidine (PEPCID) 20 MG tablet famotidine 20 mg tablet  TK 1 T PO QD    . folic acid (FOLVITE) 1 MG tablet Take by mouth.    Marland Kitchen  inFLIXimab (REMICADE IV) Inject 600 mg into the vein every 8 (eight) weeks.    . insulin degludec (TRESIBA FLEXTOUCH) 200 UNIT/ML FlexTouch Pen ADMINISTER 40 UNITS UNDER THE SKIN DAILY    . lisinopril (PRINIVIL,ZESTRIL) 5 MG tablet Take 5 mg by mouth daily.    . meloxicam (MOBIC) 7.5 MG tablet Take 1 tablet by mouth daily.    . metFORMIN (GLUCOPHAGE-XR) 500 MG 24 hr tablet TK 2 TS PO BID    . Methotrexate, PF, 10 MG/0.4ML SOAJ Inject into the skin.    Marland Kitchen ondansetron (ZOFRAN) 8 MG tablet Take 1 tablet by mouth every 8 (eight) hours as needed.    . budesonide-formoterol (SYMBICORT) 160-4.5 MCG/ACT inhaler Inhale 2 puffs into the lungs 2 (two) times daily. (Patient not taking: Reported on 07/24/2020)    . Calcium Carb-Cholecalciferol 1000-800 MG-UNIT TABS Take  1 tablet by mouth daily. (Patient not taking: Reported on 07/24/2020)    . promethazine-dextromethorphan (PROMETHAZINE-DM) 6.25-15 MG/5ML syrup Take 5 mLs by mouth at bedtime as needed. (Patient not taking: Reported on 07/24/2020)    . BIOTIN PO Take 5,000 mcg by mouth daily.    Marland Kitchen CALCIUM PO Take 5,000 mg by mouth daily.    Marland Kitchen LEVEMIR FLEXTOUCH 100 UNIT/ML Pen INJECT 60 UNITS UNDER THE SKIN QD    . ranitidine (ZANTAC) 150 MG tablet Take by mouth.    Marland Kitchen VITAMIN D PO Take 5,000 Units by mouth daily.     No facility-administered medications prior to visit.         Objective:   Physical Exam Vitals:   07/24/20 1131  BP: 130/78  Pulse: (!) 57  Temp: 97.8 F (36.6 C)  TempSrc: Temporal  SpO2: 94%  Weight: 265 lb 3.2 oz (120.3 kg)  Height: 5\' 11"  (1.803 m)         Assessment & Plan:  Pulmonary nodules/lesions, multiple Bilateral solid pulmonary nodules found on CT chest.  Patient believes that he may have had nodule seen on remote imaging as well but cannot remember the exact details.  I suspect that these are rheumatoid nodules given his history.  Certainly need to be followed to ensure no evidence of malignancy although he is a low risk patient.  Also need to consider possible opportunistic infection since he is on methotrexate and Remicade.  No indication for bronchoscopy currently.  We will follow for interval stability in 6 months  Cough Based on history suspicious for possible asthma plus superimposed upper airway irritation.  We will plan for pulmonary function testing.  Hold off on scheduling bronchodilator therapy for now.  He will keep albuterol available to use as needed.  We will need to troubleshoot possible contributors including GERD, rhinitis going forward.   Baltazar Apo, MD, PhD 07/24/2020, 1:46 PM [facility] [phone] or if no answer before 7:00PM call [phone] For any issues after 7:00PM please call eLink [phone]

## 2020-07-24 NOTE — Patient Instructions (Addendum)
We will plan to repeat your CT scan of the chest without contrast in September 2022 to follow-up pulmonary nodules We will perform pulmonary function testing at your next office visit Keep albuterol available use 2 puffs if needed for shortness of breath, chest tightness, wheezing, coughing spells. Follow with Dr. Lamonte Sakai next available with full pulmonary function testing on the same day.

## 2020-08-25 ENCOUNTER — Other Ambulatory Visit (HOSPITAL_COMMUNITY)
Admission: RE | Admit: 2020-08-25 | Discharge: 2020-08-25 | Disposition: A | Discharge status: Home / Self Care | Payer: Medicare Other | Source: Ambulatory Visit | Attending: Emergency Medicine | Admitting: Emergency Medicine

## 2020-08-25 DIAGNOSIS — Z20822 Contact with and (suspected) exposure to covid-19: Secondary | ICD-10-CM | POA: Diagnosis not present

## 2020-08-25 DIAGNOSIS — Z01812 Encounter for preprocedural laboratory examination: Secondary | ICD-10-CM | POA: Insufficient documentation

## 2020-08-25 DIAGNOSIS — M0579 Rheumatoid arthritis with rheumatoid factor of multiple sites without organ or systems involvement: Secondary | ICD-10-CM | POA: Diagnosis not present

## 2020-08-25 LAB — SARS CORONAVIRUS 2 (TAT 6-24 HRS): SARS Coronavirus 2: NEGATIVE

## 2020-08-27 ENCOUNTER — Ambulatory Visit (INDEPENDENT_AMBULATORY_CARE_PROVIDER_SITE_OTHER): Payer: Medicare Other | Admitting: Emergency Medicine

## 2020-08-27 ENCOUNTER — Encounter: Payer: Self-pay | Admitting: *Deleted

## 2020-08-27 ENCOUNTER — Other Ambulatory Visit: Payer: Self-pay

## 2020-08-27 DIAGNOSIS — R918 Other nonspecific abnormal finding of lung field: Secondary | ICD-10-CM | POA: Diagnosis not present

## 2020-08-27 DIAGNOSIS — R059 Cough, unspecified: Secondary | ICD-10-CM

## 2020-08-27 DIAGNOSIS — R911 Solitary pulmonary nodule: Secondary | ICD-10-CM

## 2020-08-27 DIAGNOSIS — J4520 Mild intermittent asthma, uncomplicated: Secondary | ICD-10-CM

## 2020-08-27 LAB — PULMONARY FUNCTION TEST
DL/VA % pred: 104 %
DL/VA: 4.15 ml/min/mmHg/L
DLCO cor % pred: 109 %
DLCO cor: 27.61 ml/min/mmHg
DLCO unc % pred: 109 %
DLCO unc: 27.61 ml/min/mmHg
FEF 25-75 Post: 2.98 L/sec
FEF 25-75 Pre: 1.73 L/sec
FEF2575-%Change-Post: 72 %
FEF2575-%Pred-Post: 131 %
FEF2575-%Pred-Pre: 76 %
FEV1-%Change-Post: 15 %
FEV1-%Pred-Post: 96 %
FEV1-%Pred-Pre: 84 %
FEV1-Post: 3.01 L
FEV1-Pre: 2.61 L
FEV1FVC-%Change-Post: 6 %
FEV1FVC-%Pred-Pre: 98 %
FEV6-%Change-Post: 7 %
FEV6-%Pred-Post: 96 %
FEV6-%Pred-Pre: 90 %
FEV6-Post: 3.88 L
FEV6-Pre: 3.62 L
FEV6FVC-%Change-Post: 0 %
FEV6FVC-%Pred-Post: 104 %
FEV6FVC-%Pred-Pre: 105 %
FVC-%Change-Post: 7 %
FVC-%Pred-Post: 92 %
FVC-%Pred-Pre: 85 %
FVC-Post: 3.94 L
FVC-Pre: 3.65 L
Post FEV1/FVC ratio: 76 %
Post FEV6/FVC ratio: 98 %
Pre FEV1/FVC ratio: 71 %
Pre FEV6/FVC Ratio: 99 %
RV % pred: 127 %
RV: 3.24 L
TLC % pred: 99 %
TLC: 7.02 L

## 2020-08-27 MED ORDER — ALBUTEROL SULFATE HFA 108 (90 BASE) MCG/ACT IN AERS: 2.0000 | INHALATION_SPRAY | Freq: Four times a day (QID) | RESPIRATORY_TRACT | 3 refills | Status: AC | PRN

## 2020-08-27 NOTE — Assessment & Plan Note (Addendum)
Mild obstruction and a bronchodilator response confirmed on his pulmonary function testing from today.  He was using both Symbicort and albuterol as rescue, used rarely only a few times a month.  We will stop Symbicort, refill his albuterol so that he can use it when needed.  Consider restarting maintenance BD therapy depending on how his symptoms progress especially during the cold weather months.

## 2020-08-27 NOTE — Patient Instructions (Signed)
Your pulmonary function testing done today confirms mild asthma. Keep your albuterol available to use 2 puffs up to every 4 hours if needed for shortness of breath, coughing, wheezing. Stop Symbicort for now. Depending on how much you are coughing in the coming months we may decide to make other adjustments to your medicines Plan for repeat CT scan of the chest in September 2022 to follow your pulmonary nodules Follow Dr. Lamonte Sakai in September after your CT so that we can review results together.

## 2020-08-27 NOTE — Assessment & Plan Note (Signed)
Suspected these are rheumatoid nodules.  No clear evidence for opportunistic infection (on methotrexate, Remicade).  Planning for a repeat CT chest in September 2022 to assess for interval change.  We will determine any further work-up depending on that scan.

## 2020-08-27 NOTE — Progress Notes (Signed)
Subjective:    Patient ID: Harry Burton, male    DOB: [birth year], 75 y.o.   MRN: [id number]  HPI 75 year old never smoker with a history of obstructive sleep apnea, longstanding rheumatoid arthritis on methotrexate and Remicade, hypertension, diabetes, basal cell skin CA, allergic rhinitis. He has been told that he has possible exercise induced asthma in the past - used albuterol, usually in cold months. Was started on Symbicort about 5 years ago, unsure that it has done much for him.   He was seen with cough in late Feb into March, was seen by PCP.  Diagnosed with a possible community-acquired pneumonia 05/27/2020 and treated with levofloxacin.  Chest x-ray from 05/27/2020 reviewed, showed ectatic thoracic aorta, normal heart size, question left midlung zone pulmonary nodule 8-9 mm without any evidence of infiltrate.  CT scan of his chest performed on 06/01/2020 reviewed by me showed no infiltrates, no mediastinal or axillary, hilar lymphadenopathy, 7 x 6 mm subpleural right lower lobe pulmonary nodule, 8 x 11 mm pleural-based left lower lobe pulmonary nodule.  ROV 08/27/20 --follow-up visit 75 year old never smoker who has a history of rheumatoid arthritis on methotrexate and Remicade.  Also with obstructive sleep apnea, hypertension, diabetes, allergic rhinitis.  Questionable diagnosis of exercise-induced asthma, treated with Symbicort in the past.  I saw him for cough, asthma and pulmonary nodular disease on CT chest from 06/01/2020, largest 11 mm.  Not currently on GERD therapy or allergy therapy.  He is on lisinopril. He coughs in the cold weather. Has wheeze in the allergy seasons, in the winter. Uses both his symbicort and albuterol prn, does not use symbicort on a schedule.   Pulmonary function testing done today reviewed by me, show grossly normal airflows but obstruction confirmed by positive bronchodilator response.  Normal lung volumes.  Normal diffusion capacity.   Review of Systems As per  HPI  Past Medical History:  Diagnosis Date  . Diabetes ([place])   . Hypertension   . Rheumatoid aortitis   . Skin cancer    basal cell     Family History  Problem Relation Age of Onset  . Stroke Mother   . Rheum arthritis Father   . Cancer Father        unsure of type    No lung CA immediate family, father did have CA of some kind.   Social History   Socioeconomic History  . Marital status: Single    Spouse name: Not on file  . Number of children: 1  . Years of education: college  . Highest education level: Bachelor's degree (e.g., BA, AB, BS)  Occupational History  . Occupation: Retired   Tobacco Use  . Smoking status: Never Smoker  . Smokeless tobacco: Never Used  Substance and Sexual Activity  . Alcohol use: Not Currently  . Drug use: Never  . Sexual activity: Not on file  Other Topics Concern  . Not on file  Social History Narrative   Lives at home alone.   Right-handed.   One adopted child.   One cup caffeine per day.   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 Penicillin G Rash     Outpatient Medications Prior to Visit  Medication Sig Dispense Refill  . albuterol (VENTOLIN HFA) 108 (90 Base) MCG/ACT inhaler Inhale 2 puffs into the lungs every 6 (six) hours as needed.    . ASPIRIN 81 PO Take 81 mg by mouth daily.    . budesonide-formoterol (SYMBICORT) 160-4.5 MCG/ACT inhaler Inhale 2 puffs into the lungs 2 (two) times daily.    . Calcium Carb-Cholecalciferol 1000-800 MG-UNIT TABS Take 1 tablet by mouth daily.    . Cholecalciferol  (VITAMIN D3) 1.25 MG (50000 UT) CAPS Take 1 capsule by mouth daily.    . Cyanocobalamin (B-12 PO) Take 5,000 mcg by mouth daily.    . folic acid (FOLVITE) 1 MG tablet Take by mouth.    . inFLIXimab (REMICADE IV) Inject 600 mg into the vein every 8 (eight) weeks.    . insulin degludec (TRESIBA FLEXTOUCH) 200 UNIT/ML FlexTouch Pen ADMINISTER 40 UNITS UNDER THE SKIN DAILY    . lisinopril (PRINIVIL,ZESTRIL) 5 MG tablet Take 5 mg by mouth daily.    . meloxicam (MOBIC) 7.5 MG tablet Take 1 tablet by mouth daily.    . metFORMIN (GLUCOPHAGE-XR) 500 MG 24 hr tablet TK 2 TS PO BID    . Methotrexate, PF, 10 MG/0.4ML SOAJ Inject into the skin.    Marland Kitchen ondansetron (ZOFRAN) 8 MG tablet Take 1 tablet by mouth every 8 (eight) hours as needed.    . famotidine (PEPCID) 20 MG tablet famotidine 20 mg tablet  TK 1 T PO QD    . promethazine-dextromethorphan (PROMETHAZINE-DM) 6.25-15 MG/5ML syrup Take 5 mLs by mouth at bedtime as needed.     No facility-administered medications prior to visit.         Objective:   Physical Exam Vitals:   08/27/20 1434  BP: 124/74  Pulse: 82  Temp: (!) 97.5 F (36.4 C)  TempSrc: Temporal  SpO2: 96%  Weight: 265 lb 3.2 oz (120.3 kg)  Height: 5\' 10"  (1.778 m)    Gen: Pleasant, well-nourished, in no distress,  normal affect  ENT: No lesions,  mouth clear,  oropharynx clear, no postnasal drip  Neck: No JVD, no stridor  Lungs: No use of accessory muscles, no crackles or wheezing on normal respiration, no wheeze on forced expiration  Cardiovascular: RRR, heart sounds normal, no murmur or gallops, no peripheral edema  Musculoskeletal: No deformities, no cyanosis or clubbing  Neuro: alert, awake, non focal  Skin: Warm, no lesions or rash       Assessment & Plan:  Mild intermittent asthma Mild obstruction and a bronchodilator response confirmed on his pulmonary function testing from today.  He was using both Symbicort and albuterol as rescue, used rarely only a  few times a month.  We will stop Symbicort, refill his albuterol so that he can use it when needed.  Consider restarting maintenance BD therapy depending on how his symptoms progress especially during the cold weather months.  Pulmonary nodules/lesions, multiple Suspected these are rheumatoid nodules.  No clear evidence for opportunistic infection (on methotrexate, Remicade).  Planning for a repeat CT chest in September 2022 to assess for interval change.  We will determine any further work-up depending on that scan.  Cough With contributions of rhinitis, GERD, his mild intermittent asthma.  He is on lisinopril which is likely contributor.  He is most bothered by this during the cold weather  months, not currently experiencing any cough.  I do not feel compelled to stop his lisinopril for now.  Depending on how the cough progresses we may need to do so, may need to more aggressively treat GERD, rhinitis.   Baltazar Apo, MD, PhD 08/27/2020, 2:50 PM [facility] [phone] or if no answer before 7:00PM call [phone] For any issues after 7:00PM please call eLink [phone]

## 2020-08-27 NOTE — Addendum Note (Signed)
Addended by: Gavin Potters R on: 08/27/2020 02:53 PM   Modules accepted: Orders

## 2020-08-27 NOTE — Progress Notes (Signed)
PFT done today. 

## 2020-08-27 NOTE — Assessment & Plan Note (Addendum)
With contributions of rhinitis, GERD, his mild intermittent asthma.  He is on lisinopril which is likely contributor.  He is most bothered by this during the cold weather months, not currently experiencing any cough.  I do not feel compelled to stop his lisinopril for now.  Depending on how the cough progresses we may need to do so, may need to more aggressively treat GERD, rhinitis.

## 2020-09-07 DIAGNOSIS — L820 Inflamed seborrheic keratosis: Secondary | ICD-10-CM | POA: Diagnosis not present

## 2020-09-07 DIAGNOSIS — L821 Other seborrheic keratosis: Secondary | ICD-10-CM | POA: Diagnosis not present

## 2020-09-07 DIAGNOSIS — Z85828 Personal history of other malignant neoplasm of skin: Secondary | ICD-10-CM | POA: Diagnosis not present

## 2020-09-07 DIAGNOSIS — D2262 Melanocytic nevi of left upper limb, including shoulder: Secondary | ICD-10-CM | POA: Diagnosis not present

## 2020-09-07 DIAGNOSIS — L814 Other melanin hyperpigmentation: Secondary | ICD-10-CM | POA: Diagnosis not present

## 2020-09-07 DIAGNOSIS — L570 Actinic keratosis: Secondary | ICD-10-CM | POA: Diagnosis not present

## 2020-09-07 DIAGNOSIS — D225 Melanocytic nevi of trunk: Secondary | ICD-10-CM | POA: Diagnosis not present

## 2020-09-07 DIAGNOSIS — D044 Carcinoma in situ of skin of scalp and neck: Secondary | ICD-10-CM | POA: Diagnosis not present

## 2020-09-16 DIAGNOSIS — Z794 Long term (current) use of insulin: Secondary | ICD-10-CM | POA: Diagnosis not present

## 2020-09-16 DIAGNOSIS — E1142 Type 2 diabetes mellitus with diabetic polyneuropathy: Secondary | ICD-10-CM | POA: Diagnosis not present

## 2020-09-23 DIAGNOSIS — I495 Sick sinus syndrome: Secondary | ICD-10-CM | POA: Diagnosis not present

## 2020-09-23 DIAGNOSIS — I10 Essential (primary) hypertension: Secondary | ICD-10-CM | POA: Diagnosis not present

## 2020-09-23 DIAGNOSIS — M069 Rheumatoid arthritis, unspecified: Secondary | ICD-10-CM | POA: Diagnosis not present

## 2020-09-23 DIAGNOSIS — Z794 Long term (current) use of insulin: Secondary | ICD-10-CM | POA: Diagnosis not present

## 2020-09-23 DIAGNOSIS — E1142 Type 2 diabetes mellitus with diabetic polyneuropathy: Secondary | ICD-10-CM | POA: Diagnosis not present

## 2020-10-07 DIAGNOSIS — G629 Polyneuropathy, unspecified: Secondary | ICD-10-CM | POA: Diagnosis not present

## 2020-10-07 DIAGNOSIS — M0579 Rheumatoid arthritis with rheumatoid factor of multiple sites without organ or systems involvement: Secondary | ICD-10-CM | POA: Diagnosis not present

## 2020-10-07 DIAGNOSIS — M150 Primary generalized (osteo)arthritis: Secondary | ICD-10-CM | POA: Diagnosis not present

## 2020-10-07 DIAGNOSIS — E119 Type 2 diabetes mellitus without complications: Secondary | ICD-10-CM | POA: Diagnosis not present

## 2020-10-07 DIAGNOSIS — Z794 Long term (current) use of insulin: Secondary | ICD-10-CM | POA: Diagnosis not present

## 2020-10-07 DIAGNOSIS — M25521 Pain in right elbow: Secondary | ICD-10-CM | POA: Diagnosis not present

## 2020-10-07 DIAGNOSIS — E669 Obesity, unspecified: Secondary | ICD-10-CM | POA: Diagnosis not present

## 2020-10-07 DIAGNOSIS — Z6836 Body mass index (BMI) 36.0-36.9, adult: Secondary | ICD-10-CM | POA: Diagnosis not present

## 2020-10-13 DIAGNOSIS — J449 Chronic obstructive pulmonary disease, unspecified: Secondary | ICD-10-CM | POA: Diagnosis not present

## 2020-10-13 DIAGNOSIS — E1169 Type 2 diabetes mellitus with other specified complication: Secondary | ICD-10-CM | POA: Diagnosis not present

## 2020-10-13 DIAGNOSIS — E785 Hyperlipidemia, unspecified: Secondary | ICD-10-CM | POA: Diagnosis not present

## 2020-10-13 DIAGNOSIS — M069 Rheumatoid arthritis, unspecified: Secondary | ICD-10-CM | POA: Diagnosis not present

## 2020-10-13 DIAGNOSIS — Z0000 Encounter for general adult medical examination without abnormal findings: Secondary | ICD-10-CM | POA: Diagnosis not present

## 2020-10-13 DIAGNOSIS — Z79899 Other long term (current) drug therapy: Secondary | ICD-10-CM | POA: Diagnosis not present

## 2020-10-20 DIAGNOSIS — M0579 Rheumatoid arthritis with rheumatoid factor of multiple sites without organ or systems involvement: Secondary | ICD-10-CM | POA: Diagnosis not present

## 2020-10-20 DIAGNOSIS — Z79899 Other long term (current) drug therapy: Secondary | ICD-10-CM | POA: Diagnosis not present

## 2020-10-20 DIAGNOSIS — Z111 Encounter for screening for respiratory tuberculosis: Secondary | ICD-10-CM | POA: Diagnosis not present

## 2020-10-20 DIAGNOSIS — R5383 Other fatigue: Secondary | ICD-10-CM | POA: Diagnosis not present

## 2020-11-14 ENCOUNTER — Other Ambulatory Visit: Payer: Self-pay

## 2020-11-14 ENCOUNTER — Ambulatory Visit
Admission: RE | Admit: 2020-11-14 | Discharge: 2020-11-14 | Disposition: A | Discharge status: Home / Self Care | Payer: Medicare Other | Source: Ambulatory Visit | Attending: Emergency Medicine | Admitting: Emergency Medicine

## 2020-11-14 DIAGNOSIS — R911 Solitary pulmonary nodule: Secondary | ICD-10-CM | POA: Diagnosis not present

## 2020-11-14 DIAGNOSIS — R918 Other nonspecific abnormal finding of lung field: Secondary | ICD-10-CM | POA: Diagnosis not present

## 2020-11-14 DIAGNOSIS — I700 Atherosclerosis of aorta: Secondary | ICD-10-CM | POA: Diagnosis not present

## 2020-11-21 ENCOUNTER — Other Ambulatory Visit: Payer: Medicare Other

## 2020-12-01 ENCOUNTER — Encounter: Payer: Self-pay | Admitting: Emergency Medicine

## 2020-12-01 ENCOUNTER — Other Ambulatory Visit: Payer: Self-pay

## 2020-12-01 ENCOUNTER — Ambulatory Visit (INDEPENDENT_AMBULATORY_CARE_PROVIDER_SITE_OTHER): Payer: Medicare Other | Admitting: Emergency Medicine

## 2020-12-01 VITALS — BP 130/80 | HR 86 | Temp 98.3°F | Ht 70.0 in | Wt 263.8 lb

## 2020-12-01 DIAGNOSIS — J4520 Mild intermittent asthma, uncomplicated: Secondary | ICD-10-CM | POA: Diagnosis not present

## 2020-12-01 DIAGNOSIS — R918 Other nonspecific abnormal finding of lung field: Secondary | ICD-10-CM

## 2020-12-01 DIAGNOSIS — Z23 Encounter for immunization: Secondary | ICD-10-CM | POA: Diagnosis not present

## 2020-12-01 NOTE — Addendum Note (Signed)
Addended by: Lorretta Harp on: 12/01/2020 11:54 AM   Modules accepted: Orders

## 2020-12-01 NOTE — Progress Notes (Signed)
Subjective:    Patient ID: Harry Burton, male    DOB: 03/09/1946, 75 y.o.   MRN: [id number]  HPI  ROV 08/27/20 --follow-up visit 75 year old never smoker who has a history of rheumatoid arthritis on methotrexate and Remicade.  Also with obstructive sleep apnea, hypertension, diabetes, allergic rhinitis.  Questionable diagnosis of exercise-induced asthma, treated with Symbicort in the past.  I saw him for cough, asthma and pulmonary nodular disease on CT chest from 06/01/2020, largest 11 mm.  Not currently on GERD therapy or allergy therapy.  He is on lisinopril. He coughs in the cold weather. Has wheeze in the allergy seasons, in the winter. Uses both his symbicort and albuterol prn, does not use symbicort on a schedule.   Pulmonary function testing done today reviewed by me, show grossly normal airflows but obstruction confirmed by positive bronchodilator response.  Normal lung volumes.  Normal diffusion capacity.  ROV 12/01/20 --Harry Burton is 75, never smoker with rheumatoid arthritis on methotrexate and Remicade.  Also with mild obstructive lung disease consistent with asthma.  He has albuterol available, uses approximately Also following scattered pulmonary nodules, thought to be most consistent with rheumatoid nodules without any evidence of opportunistic infection.  Largest nodule 11 mm on CT chest 06/01/2020. Good exertional tolerance. Rare albuterol use.   Repeat CT chest performed on 11/14/2020 reviewed by me, shows stable 7 x 6 mm right lower lobe nodule, stable 8 x 11 mm left lower lobe pleural-based nodule, no new alarming nodules.   Review of Systems As per HPI   Allergies  Allergen Reactions   Penicillin G Rash     Outpatient Medications Prior to Visit  Medication Sig Dispense Refill   albuterol (VENTOLIN HFA) 108 (90 Base) MCG/ACT inhaler Inhale 2 puffs into the lungs every 6 (six) hours as needed. 18 g 3   ASPIRIN 81 PO Take 81 mg by mouth daily.     budesonide-formoterol  (SYMBICORT) 160-4.5 MCG/ACT inhaler Inhale 2 puffs into the lungs 2 (two) times daily.     Calcium Carb-Cholecalciferol 1000-800 MG-UNIT TABS Take 1 tablet by mouth daily.     Cholecalciferol (VITAMIN D3) 125 MCG (5000 UT) CAPS Take 1 capsule by mouth daily.     Cyanocobalamin (B-12 PO) Take 5,000 mcg by mouth daily.     folic acid (FOLVITE) 1 MG tablet Take by mouth.     inFLIXimab (REMICADE IV) Inject 600 mg into the vein every 8 (eight) weeks.     insulin degludec (TRESIBA FLEXTOUCH) 200 UNIT/ML FlexTouch Pen ADMINISTER 40 UNITS UNDER THE SKIN DAILY     lisinopril (PRINIVIL,ZESTRIL) 5 MG tablet Take 5 mg by mouth daily.     meloxicam (MOBIC) 7.5 MG tablet Take 1 tablet by mouth daily.     metFORMIN (GLUCOPHAGE-XR) 500 MG 24 hr tablet TK 2 TS PO BID     Methotrexate, PF, 10 MG/0.4ML SOAJ Inject into the skin.     ondansetron (ZOFRAN) 8 MG tablet Take 1 tablet by mouth every 8 (eight) hours as needed.     famotidine (PEPCID) 20 MG tablet famotidine 20 mg tablet  TK 1 T PO QD     promethazine-dextromethorphan (PROMETHAZINE-DM) 6.25-15 MG/5ML syrup Take 5 mLs by mouth at bedtime as needed.     No facility-administered medications prior to visit.         Objective:   Physical Exam Vitals:   12/01/20 1058  BP: 130/80  Pulse: 86  Temp: 98.3 F (36.8 C)  TempSrc: Oral  SpO2: 100%  Weight: 263 lb 12.8 oz (119.7 kg)  Height: '5\' 10"'$  (1.778 m)    Gen: Pleasant, well-nourished, in no distress,  normal affect  ENT: No lesions,  mouth clear,  oropharynx clear, no postnasal drip  Neck: No JVD, no stridor  Lungs: No use of accessory muscles, no crackles or wheezing on normal respiration, there is some bilateral wheeze and cough on forced expiration  Cardiovascular: RRR, heart sounds normal, no murmur or gallops, no peripheral edema  Musculoskeletal: No deformities, no cyanosis or clubbing  Neuro: alert, awake, non focal  Skin: Warm, no lesions or rash       Assessment & Plan:   Pulmonary nodules/lesions, multiple Stable on repeat CT chest.  Suspect that these are related to his rheumatoid arthritis.  No evidence of opportunistic infection.  Plan to repeat his CT in August 2023 unless he has any respiratory symptoms in the interim in which case we will perform sooner.  Mild intermittent asthma Overall well controlled.  Rare albuterol use although he believes he may have to increase this fall which is his active time of the year.  Did briefly discussed the possibility of getting back on scheduled Symbicort during the fall.  For now we will hold off. Planning for the flu shot today.  He will get the new COVID-19 booster shot when it is available this fall   Baltazar Apo, MD, PhD 12/01/2020, 11:27 AM [facility] [phone] or if no answer before 7:00PM call [phone] For any issues after 7:00PM please call eLink [phone]

## 2020-12-01 NOTE — Assessment & Plan Note (Signed)
Stable on repeat CT chest.  Suspect that these are related to his rheumatoid arthritis.  No evidence of opportunistic infection.  Plan to repeat his CT in August 2023 unless he has any respiratory symptoms in the interim in which case we will perform sooner.

## 2020-12-01 NOTE — Assessment & Plan Note (Addendum)
Overall well controlled.  Rare albuterol use although he believes he may have to increase this fall which is his active time of the year.  Did briefly discussed the possibility of getting back on scheduled Symbicort during the fall.  For now we will hold off. Planning for the flu shot today.  He will get the new COVID-19 booster shot when it is available this fall

## 2020-12-01 NOTE — Patient Instructions (Addendum)
Please keep your albuterol available to use 2 puffs if needed for shortness of breath, chest tightness, wheezing. We will hold off on restarting Symbicort at this time. Plan to repeat your CT scan of the chest without contrast in August 2023 to follow for stability. Follow Dr. Lamonte Sakai in 1 year to review your scan or sooner if you have any problems.

## 2020-12-16 DIAGNOSIS — M0579 Rheumatoid arthritis with rheumatoid factor of multiple sites without organ or systems involvement: Secondary | ICD-10-CM | POA: Diagnosis not present

## 2020-12-29 DIAGNOSIS — Z23 Encounter for immunization: Secondary | ICD-10-CM | POA: Diagnosis not present

## 2021-01-05 DIAGNOSIS — Z23 Encounter for immunization: Secondary | ICD-10-CM | POA: Diagnosis not present

## 2021-01-12 NOTE — Telephone Encounter (Signed)
Immunization record updated. Nothing further needed at this time.

## 2021-01-13 DIAGNOSIS — J449 Chronic obstructive pulmonary disease, unspecified: Secondary | ICD-10-CM | POA: Diagnosis not present

## 2021-01-13 DIAGNOSIS — M069 Rheumatoid arthritis, unspecified: Secondary | ICD-10-CM | POA: Diagnosis not present

## 2021-01-13 DIAGNOSIS — R7401 Elevation of levels of liver transaminase levels: Secondary | ICD-10-CM | POA: Diagnosis not present

## 2021-01-13 DIAGNOSIS — I10 Essential (primary) hypertension: Secondary | ICD-10-CM | POA: Diagnosis not present

## 2021-01-13 DIAGNOSIS — E1169 Type 2 diabetes mellitus with other specified complication: Secondary | ICD-10-CM | POA: Diagnosis not present

## 2021-01-19 DIAGNOSIS — Z7984 Long term (current) use of oral hypoglycemic drugs: Secondary | ICD-10-CM | POA: Diagnosis not present

## 2021-01-19 DIAGNOSIS — Z794 Long term (current) use of insulin: Secondary | ICD-10-CM | POA: Diagnosis not present

## 2021-01-19 DIAGNOSIS — E1140 Type 2 diabetes mellitus with diabetic neuropathy, unspecified: Secondary | ICD-10-CM | POA: Diagnosis not present

## 2021-01-19 DIAGNOSIS — E1142 Type 2 diabetes mellitus with diabetic polyneuropathy: Secondary | ICD-10-CM | POA: Diagnosis not present

## 2021-02-10 DIAGNOSIS — Z79899 Other long term (current) drug therapy: Secondary | ICD-10-CM | POA: Diagnosis not present

## 2021-02-10 DIAGNOSIS — M0579 Rheumatoid arthritis with rheumatoid factor of multiple sites without organ or systems involvement: Secondary | ICD-10-CM | POA: Diagnosis not present

## 2021-04-13 DIAGNOSIS — M0579 Rheumatoid arthritis with rheumatoid factor of multiple sites without organ or systems involvement: Secondary | ICD-10-CM | POA: Diagnosis not present

## 2021-04-19 DIAGNOSIS — Z6838 Body mass index (BMI) 38.0-38.9, adult: Secondary | ICD-10-CM | POA: Diagnosis not present

## 2021-04-19 DIAGNOSIS — M25521 Pain in right elbow: Secondary | ICD-10-CM | POA: Diagnosis not present

## 2021-04-19 DIAGNOSIS — M150 Primary generalized (osteo)arthritis: Secondary | ICD-10-CM | POA: Diagnosis not present

## 2021-04-19 DIAGNOSIS — Z794 Long term (current) use of insulin: Secondary | ICD-10-CM | POA: Diagnosis not present

## 2021-04-19 DIAGNOSIS — M0579 Rheumatoid arthritis with rheumatoid factor of multiple sites without organ or systems involvement: Secondary | ICD-10-CM | POA: Diagnosis not present

## 2021-04-19 DIAGNOSIS — E119 Type 2 diabetes mellitus without complications: Secondary | ICD-10-CM | POA: Diagnosis not present

## 2021-04-19 DIAGNOSIS — E669 Obesity, unspecified: Secondary | ICD-10-CM | POA: Diagnosis not present

## 2021-04-19 DIAGNOSIS — G629 Polyneuropathy, unspecified: Secondary | ICD-10-CM | POA: Diagnosis not present

## 2021-04-27 DIAGNOSIS — E782 Mixed hyperlipidemia: Secondary | ICD-10-CM | POA: Diagnosis not present

## 2021-04-27 DIAGNOSIS — I10 Essential (primary) hypertension: Secondary | ICD-10-CM | POA: Diagnosis not present

## 2021-04-28 DIAGNOSIS — L853 Xerosis cutis: Secondary | ICD-10-CM | POA: Diagnosis not present

## 2021-04-28 DIAGNOSIS — L309 Dermatitis, unspecified: Secondary | ICD-10-CM | POA: Diagnosis not present

## 2021-04-28 DIAGNOSIS — Z85828 Personal history of other malignant neoplasm of skin: Secondary | ICD-10-CM | POA: Diagnosis not present

## 2021-04-28 DIAGNOSIS — L304 Erythema intertrigo: Secondary | ICD-10-CM | POA: Diagnosis not present

## 2021-06-08 DIAGNOSIS — M0579 Rheumatoid arthritis with rheumatoid factor of multiple sites without organ or systems involvement: Secondary | ICD-10-CM | POA: Diagnosis not present

## 2021-06-23 DIAGNOSIS — Z20828 Contact with and (suspected) exposure to other viral communicable diseases: Secondary | ICD-10-CM | POA: Diagnosis not present

## 2021-06-23 DIAGNOSIS — R0981 Nasal congestion: Secondary | ICD-10-CM | POA: Diagnosis not present

## 2021-07-05 DIAGNOSIS — Z79899 Other long term (current) drug therapy: Secondary | ICD-10-CM | POA: Diagnosis not present

## 2021-07-05 DIAGNOSIS — J449 Chronic obstructive pulmonary disease, unspecified: Secondary | ICD-10-CM | POA: Diagnosis not present

## 2021-07-05 DIAGNOSIS — M069 Rheumatoid arthritis, unspecified: Secondary | ICD-10-CM | POA: Diagnosis not present

## 2021-07-05 DIAGNOSIS — E1169 Type 2 diabetes mellitus with other specified complication: Secondary | ICD-10-CM | POA: Diagnosis not present

## 2021-08-03 DIAGNOSIS — Z7984 Long term (current) use of oral hypoglycemic drugs: Secondary | ICD-10-CM | POA: Diagnosis not present

## 2021-08-03 DIAGNOSIS — Z794 Long term (current) use of insulin: Secondary | ICD-10-CM | POA: Diagnosis not present

## 2021-08-03 DIAGNOSIS — E1140 Type 2 diabetes mellitus with diabetic neuropathy, unspecified: Secondary | ICD-10-CM | POA: Diagnosis not present

## 2021-08-03 DIAGNOSIS — M0579 Rheumatoid arthritis with rheumatoid factor of multiple sites without organ or systems involvement: Secondary | ICD-10-CM | POA: Diagnosis not present

## 2021-08-05 DIAGNOSIS — M069 Rheumatoid arthritis, unspecified: Secondary | ICD-10-CM | POA: Diagnosis not present

## 2021-08-05 DIAGNOSIS — E119 Type 2 diabetes mellitus without complications: Secondary | ICD-10-CM | POA: Diagnosis not present

## 2021-09-07 DIAGNOSIS — L821 Other seborrheic keratosis: Secondary | ICD-10-CM | POA: Diagnosis not present

## 2021-09-07 DIAGNOSIS — L304 Erythema intertrigo: Secondary | ICD-10-CM | POA: Diagnosis not present

## 2021-09-07 DIAGNOSIS — L814 Other melanin hyperpigmentation: Secondary | ICD-10-CM | POA: Diagnosis not present

## 2021-09-07 DIAGNOSIS — Z85828 Personal history of other malignant neoplasm of skin: Secondary | ICD-10-CM | POA: Diagnosis not present

## 2021-09-07 DIAGNOSIS — L570 Actinic keratosis: Secondary | ICD-10-CM | POA: Diagnosis not present

## 2021-09-21 DIAGNOSIS — J449 Chronic obstructive pulmonary disease, unspecified: Secondary | ICD-10-CM | POA: Diagnosis not present

## 2021-09-21 DIAGNOSIS — Z7984 Long term (current) use of oral hypoglycemic drugs: Secondary | ICD-10-CM | POA: Diagnosis not present

## 2021-09-21 DIAGNOSIS — E119 Type 2 diabetes mellitus without complications: Secondary | ICD-10-CM | POA: Diagnosis not present

## 2021-09-21 DIAGNOSIS — K5730 Diverticulosis of large intestine without perforation or abscess without bleeding: Secondary | ICD-10-CM | POA: Diagnosis not present

## 2021-09-21 DIAGNOSIS — M1990 Unspecified osteoarthritis, unspecified site: Secondary | ICD-10-CM | POA: Diagnosis not present

## 2021-09-21 DIAGNOSIS — Z7982 Long term (current) use of aspirin: Secondary | ICD-10-CM | POA: Diagnosis not present

## 2021-09-21 DIAGNOSIS — K644 Residual hemorrhoidal skin tags: Secondary | ICD-10-CM | POA: Diagnosis not present

## 2021-09-21 DIAGNOSIS — Z86010 Personal history of colonic polyps: Secondary | ICD-10-CM | POA: Diagnosis not present

## 2021-09-21 DIAGNOSIS — I10 Essential (primary) hypertension: Secondary | ICD-10-CM | POA: Diagnosis not present

## 2021-09-21 DIAGNOSIS — Z79899 Other long term (current) drug therapy: Secondary | ICD-10-CM | POA: Diagnosis not present

## 2021-09-21 DIAGNOSIS — K648 Other hemorrhoids: Secondary | ICD-10-CM | POA: Diagnosis not present

## 2021-09-21 DIAGNOSIS — J45909 Unspecified asthma, uncomplicated: Secondary | ICD-10-CM | POA: Diagnosis not present

## 2021-09-21 DIAGNOSIS — Z1211 Encounter for screening for malignant neoplasm of colon: Secondary | ICD-10-CM | POA: Diagnosis not present

## 2021-09-22 DIAGNOSIS — I440 Atrioventricular block, first degree: Secondary | ICD-10-CM | POA: Diagnosis not present

## 2021-09-22 DIAGNOSIS — R001 Bradycardia, unspecified: Secondary | ICD-10-CM | POA: Diagnosis not present

## 2021-09-29 DIAGNOSIS — M0579 Rheumatoid arthritis with rheumatoid factor of multiple sites without organ or systems involvement: Secondary | ICD-10-CM | POA: Diagnosis not present

## 2021-10-22 DIAGNOSIS — Z79899 Other long term (current) drug therapy: Secondary | ICD-10-CM | POA: Diagnosis not present

## 2021-10-22 DIAGNOSIS — Z125 Encounter for screening for malignant neoplasm of prostate: Secondary | ICD-10-CM | POA: Diagnosis not present

## 2021-10-22 DIAGNOSIS — J449 Chronic obstructive pulmonary disease, unspecified: Secondary | ICD-10-CM | POA: Diagnosis not present

## 2021-10-22 DIAGNOSIS — E785 Hyperlipidemia, unspecified: Secondary | ICD-10-CM | POA: Diagnosis not present

## 2021-10-22 DIAGNOSIS — M069 Rheumatoid arthritis, unspecified: Secondary | ICD-10-CM | POA: Diagnosis not present

## 2021-10-22 DIAGNOSIS — E1169 Type 2 diabetes mellitus with other specified complication: Secondary | ICD-10-CM | POA: Diagnosis not present

## 2021-10-22 DIAGNOSIS — Z0000 Encounter for general adult medical examination without abnormal findings: Secondary | ICD-10-CM | POA: Diagnosis not present

## 2021-10-25 DIAGNOSIS — B372 Candidiasis of skin and nail: Secondary | ICD-10-CM | POA: Diagnosis not present

## 2021-10-25 DIAGNOSIS — E669 Obesity, unspecified: Secondary | ICD-10-CM | POA: Diagnosis not present

## 2021-10-25 DIAGNOSIS — E119 Type 2 diabetes mellitus without complications: Secondary | ICD-10-CM | POA: Diagnosis not present

## 2021-10-25 DIAGNOSIS — E782 Mixed hyperlipidemia: Secondary | ICD-10-CM | POA: Diagnosis not present

## 2021-10-25 DIAGNOSIS — M0579 Rheumatoid arthritis with rheumatoid factor of multiple sites without organ or systems involvement: Secondary | ICD-10-CM | POA: Diagnosis not present

## 2021-10-25 DIAGNOSIS — I10 Essential (primary) hypertension: Secondary | ICD-10-CM | POA: Diagnosis not present

## 2021-10-25 DIAGNOSIS — M1991 Primary osteoarthritis, unspecified site: Secondary | ICD-10-CM | POA: Diagnosis not present

## 2021-10-25 DIAGNOSIS — Z6836 Body mass index (BMI) 36.0-36.9, adult: Secondary | ICD-10-CM | POA: Diagnosis not present

## 2021-10-25 DIAGNOSIS — G629 Polyneuropathy, unspecified: Secondary | ICD-10-CM | POA: Diagnosis not present

## 2021-10-25 DIAGNOSIS — M25521 Pain in right elbow: Secondary | ICD-10-CM | POA: Diagnosis not present

## 2021-10-25 DIAGNOSIS — Z794 Long term (current) use of insulin: Secondary | ICD-10-CM | POA: Diagnosis not present

## 2021-10-26 ENCOUNTER — Ambulatory Visit
Admission: RE | Admit: 2021-10-26 | Discharge: 2021-10-26 | Disposition: A | Discharge status: Home / Self Care | Payer: Medicare Other | Source: Ambulatory Visit | Attending: Emergency Medicine | Admitting: Emergency Medicine

## 2021-10-26 DIAGNOSIS — R911 Solitary pulmonary nodule: Secondary | ICD-10-CM | POA: Diagnosis not present

## 2021-10-26 DIAGNOSIS — I700 Atherosclerosis of aorta: Secondary | ICD-10-CM | POA: Diagnosis not present

## 2021-10-26 DIAGNOSIS — R918 Other nonspecific abnormal finding of lung field: Secondary | ICD-10-CM | POA: Diagnosis not present

## 2021-11-24 DIAGNOSIS — R5383 Other fatigue: Secondary | ICD-10-CM | POA: Diagnosis not present

## 2021-11-24 DIAGNOSIS — Z79899 Other long term (current) drug therapy: Secondary | ICD-10-CM | POA: Diagnosis not present

## 2021-11-24 DIAGNOSIS — Z111 Encounter for screening for respiratory tuberculosis: Secondary | ICD-10-CM | POA: Diagnosis not present

## 2021-11-24 DIAGNOSIS — M0579 Rheumatoid arthritis with rheumatoid factor of multiple sites without organ or systems involvement: Secondary | ICD-10-CM | POA: Diagnosis not present

## 2021-11-28 IMAGING — CT CT CHEST W/O CM
1 series · 15 of 34 positions shown, 19 images · non-contrast
Comparison: 06/01/2020

CLINICAL DATA: Pulmonary nodule

EXAM:
CT CHEST WITHOUT CONTRAST
TECHNIQUE: Multidetector CT imaging of the chest was performed following the
standard protocol without IV contrast.

[Series 2: chest w/(date) · axial · 0.88mm/px · z∈[-368,-106]mm · 15 of 155 slices shown, 19 images]
[im 12/155  mediastinal]
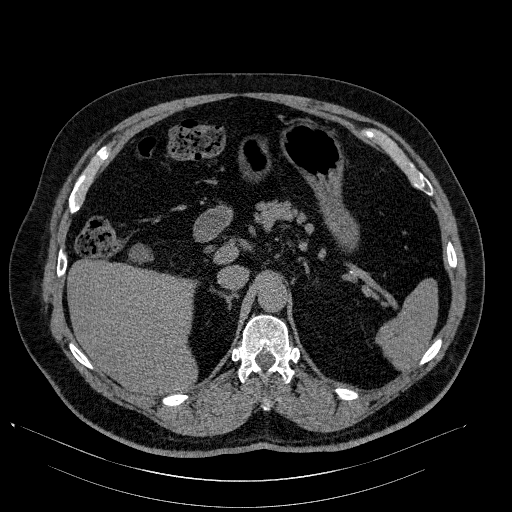
[im 12/155  lung]
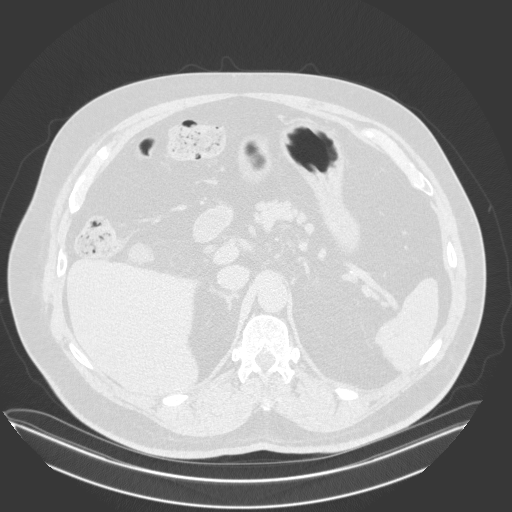
[im 23/155  lung]
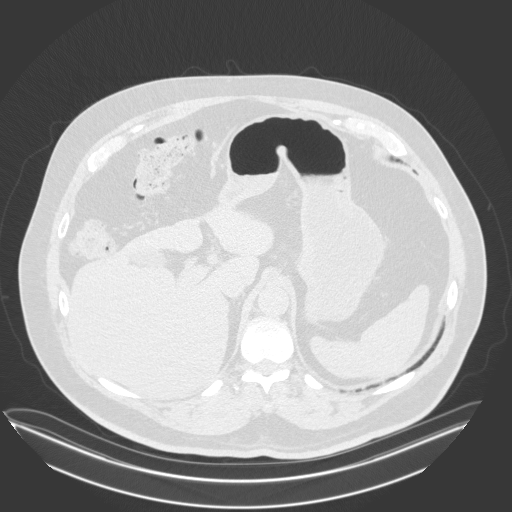
[im 31/155  lung]
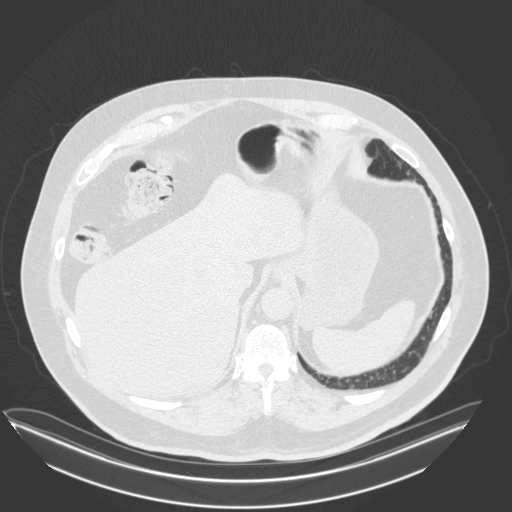
[im 40/155  lung]
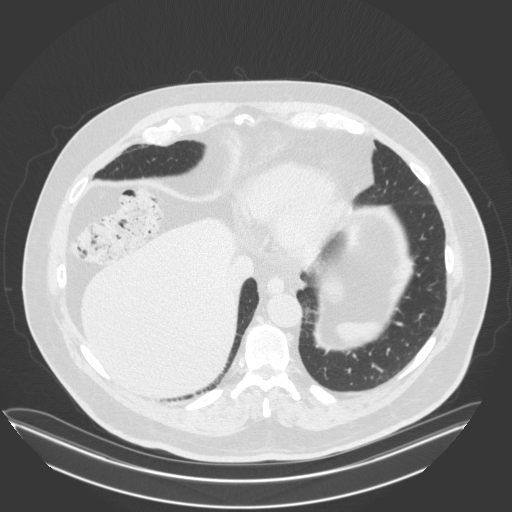
[im 52/155  mediastinal]
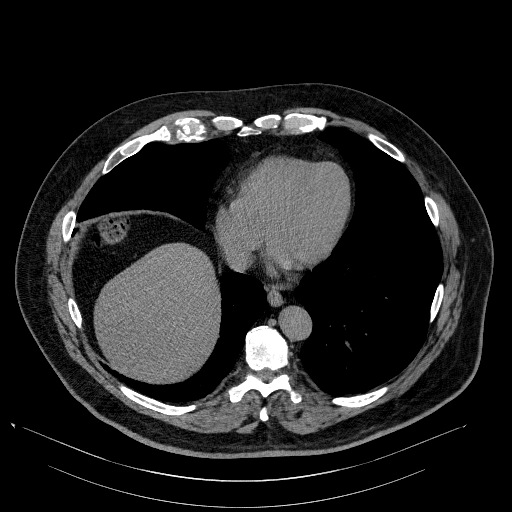
[im 52/155  lung]
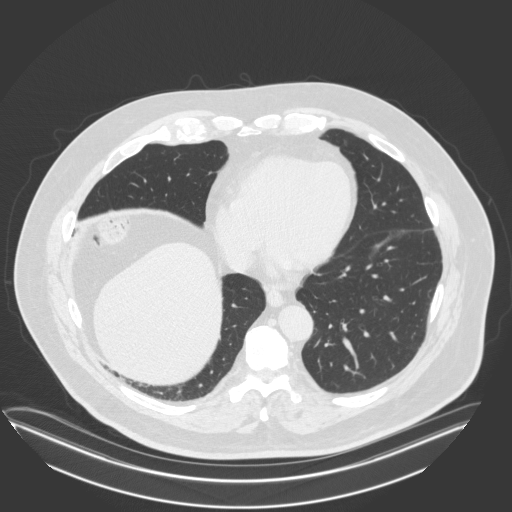
[im 62/155  lung]
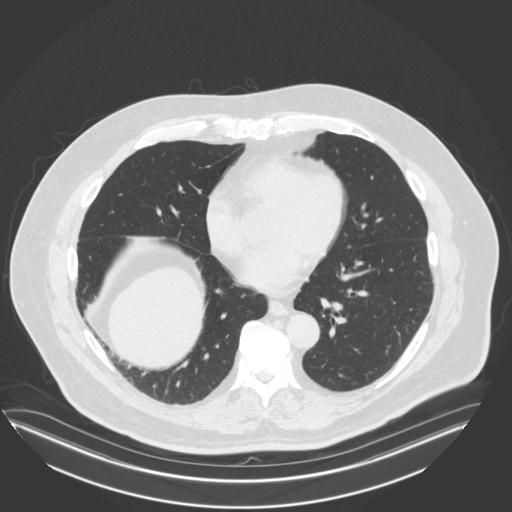
[im 69/155  lung]
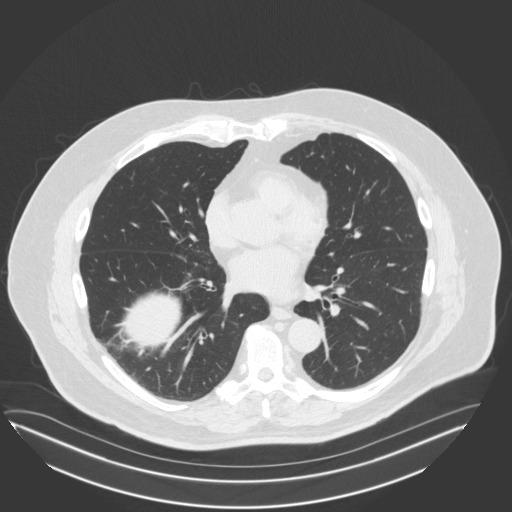
[im 80/155  lung]
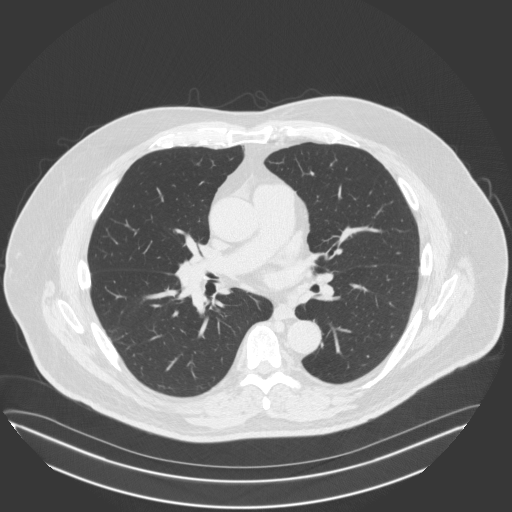
[im 86/155  mediastinal]
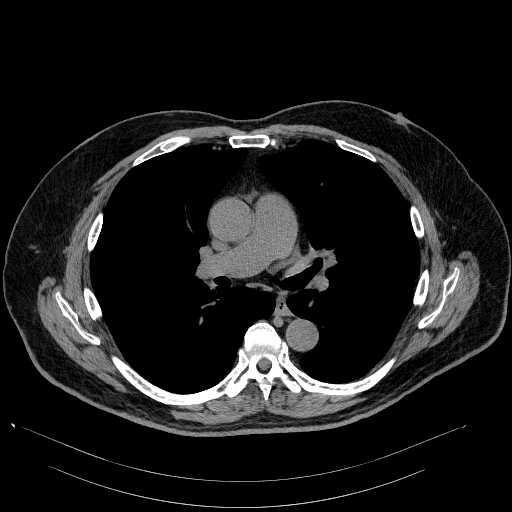
[im 86/155  lung]
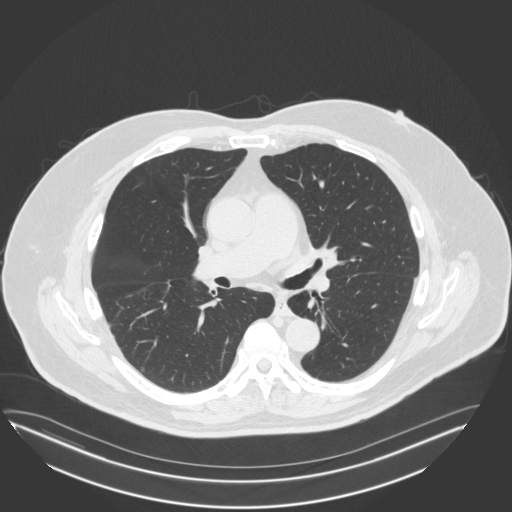
[im 93/155  lung]
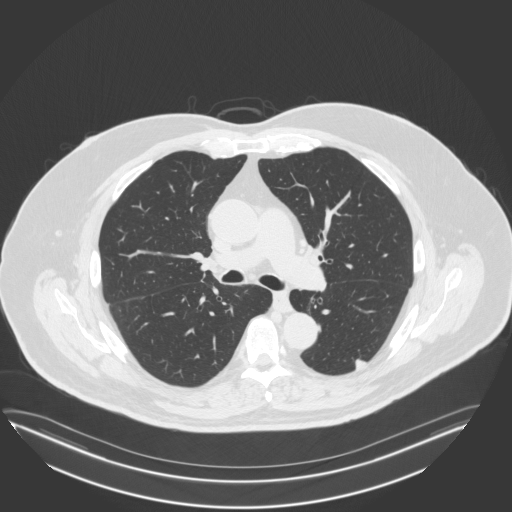
[im 103/155  lung]
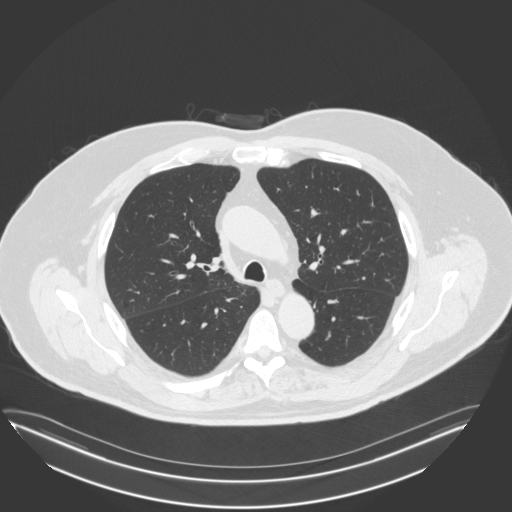
[im 115/155  lung]
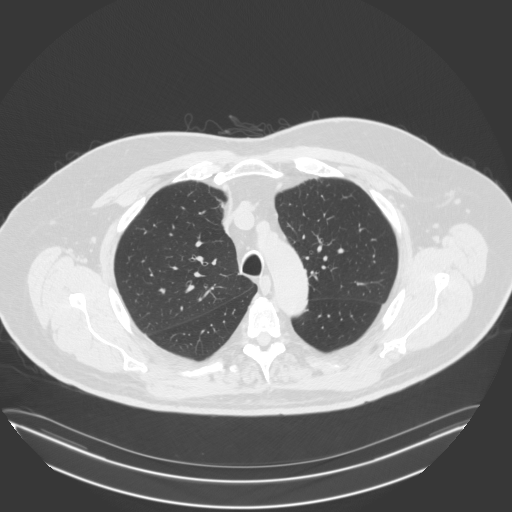
[im 124/155  mediastinal]
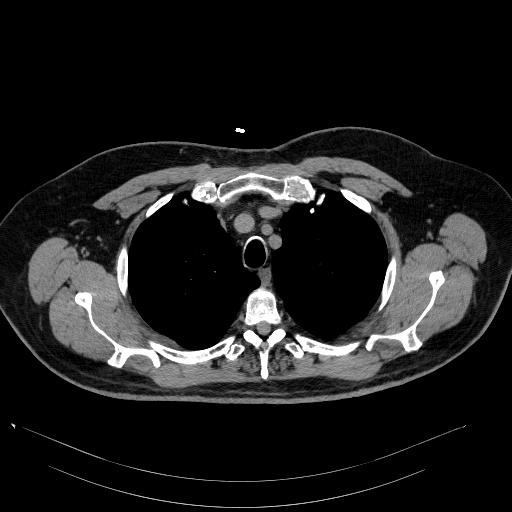
[im 124/155  lung]
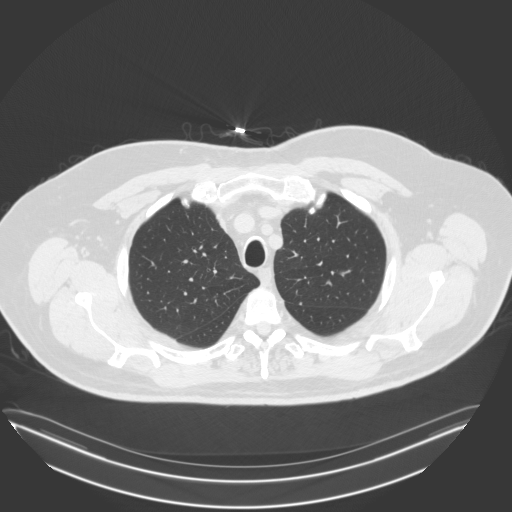
[im 132/155  lung]
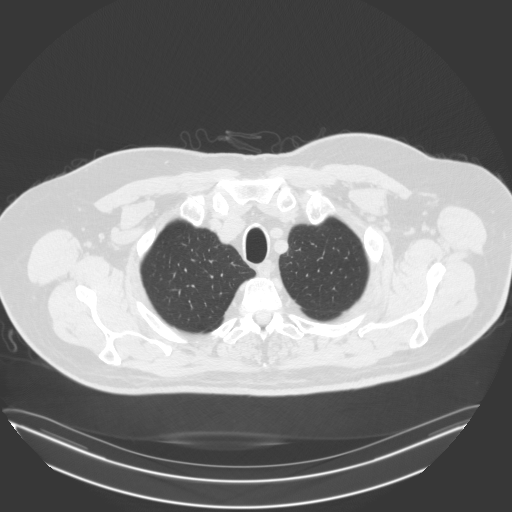
[im 143/155  lung]
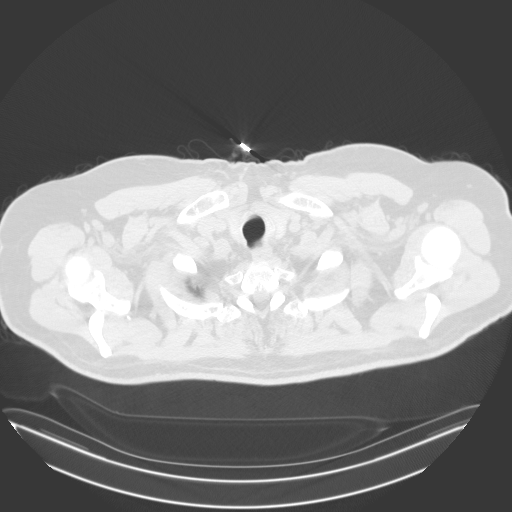

[15 of 34 positions shown; findings below may reference images not displayed]

FINDINGS: Cardiovascular: The heart size is normal. No substantial pericardial
effusion. Coronary artery calcification is evident. Mild
atherosclerotic calcification is noted in the wall of the thoracic
aorta.

Mediastinum/Nodes: No mediastinal lymphadenopathy. There is no hilar
lymphadenopathy. The esophagus has normal imaging features. There is
no axillary lymphadenopathy.

Lungs/Pleura: Stable 7 x 6 mm right lower lobe nodule on 78/5.
Pleural base left lower lobe nodule identified previously is stable
at 8 x 11 mm on image 63/5. No new suspicious nodule or mass. No
focal airspace consolidation. There is no evidence of pleural
effusion.

Upper Abdomen: The liver shows diffusely decreased attenuation
suggesting fat deposition. Tiny hypodensities in the left hepatic
lobe are too small to characterize but stable in the interval,
potentially cysts.

Musculoskeletal: No worrisome lytic or sclerotic osseous
abnormality.
IMPRESSION: 1. Stable bilateral lower lobe pulmonary nodules measuring up to 11
mm. Non-contrast chest CT at 12-18 months (from today's scan) is
considered optional for low-risk patients, but is recommended for
high-risk patients. This recommendation follows the consensus
statement: Guidelines for Management of Incidental Pulmonary Nodules
Detected on CT Images: From the [HOSPITAL] 4382; Radiology
2. Hepatic steatosis.
3. Aortic Atherosclerosis (X1HJX-453.3).

## 2021-12-01 ENCOUNTER — Encounter: Payer: Self-pay | Admitting: Emergency Medicine

## 2021-12-01 ENCOUNTER — Ambulatory Visit (INDEPENDENT_AMBULATORY_CARE_PROVIDER_SITE_OTHER): Payer: Medicare Other | Admitting: Emergency Medicine

## 2021-12-01 DIAGNOSIS — J4520 Mild intermittent asthma, uncomplicated: Secondary | ICD-10-CM

## 2021-12-01 DIAGNOSIS — R918 Other nonspecific abnormal finding of lung field: Secondary | ICD-10-CM

## 2021-12-01 NOTE — Assessment & Plan Note (Signed)
Overall stable.  Rare albuterol use, about every 2 weeks.  No flares.  Plan to continue same.  Hold off on ICS/LABA for now.

## 2021-12-01 NOTE — Addendum Note (Signed)
Addended by: Gavin Potters R on: 12/01/2021 12:02 PM   Modules accepted: Orders

## 2021-12-01 NOTE — Progress Notes (Signed)
Subjective:    Patient ID: Armaan Pond, male    DOB: Aug 18, 1945, 76 y.o.   MRN: [id number]  HPI  ROV 12/01/20 --Mr. Goldring is 87, never smoker with rheumatoid arthritis on methotrexate and Remicade.  Also with mild obstructive lung disease consistent with asthma.  He has albuterol available, uses approximately Also following scattered pulmonary nodules, thought to be most consistent with rheumatoid nodules without any evidence of opportunistic infection.  Largest nodule 11 mm on CT chest 06/01/2020. Good exertional tolerance. Rare albuterol use.   Repeat CT chest performed on 11/14/2020 reviewed by me, shows stable 7 x 6 mm right lower lobe nodule, stable 8 x 11 mm left lower lobe pleural-based nodule, no new alarming nodules.   ROV 12/01/21 --pleasant 76 year old gentleman, never smoker with a history of rheumatoid arthritis on Remicade and methotrexate.  He also has asthma with mild obstructive disease.  We have been following scattered pulmonary nodules most consistent with rheumatoid nodules in absence of any opportunistic infection.  He has been undergoing serial CT imaging to ensure stability. Today he reports  CT scan of the chest 10/26/2021 reviewed by me, shows unchanged right hemidiaphragmatic elevation with some associated atelectasis.  Multiple unchanged small bilateral pulmonary nodules up to 1.1 x 0.8 cm in the superior segment of the left lower lobe.  All unchanged   Review of Systems As per HPI   Allergies  Allergen Reactions   Penicillin G Rash     Outpatient Medications Prior to Visit  Medication Sig Dispense Refill   albuterol (VENTOLIN HFA) 108 (90 Base) MCG/ACT inhaler Inhale 2 puffs into the lungs every 6 (six) hours as needed. 18 g 3   ASPIRIN 81 PO Take 81 mg by mouth daily.     Calcium Carb-Cholecalciferol 1000-800 MG-UNIT TABS Take 1 tablet by mouth daily.     Cholecalciferol (VITAMIN D3) 125 MCG (5000 UT) CAPS Take 1 capsule by mouth daily.     Cyanocobalamin  (B-12 PO) Take 5,000 mcg by mouth daily.     folic acid (FOLVITE) 1 MG tablet Take by mouth.     inFLIXimab (REMICADE IV) Inject 600 mg into the vein every 8 (eight) weeks.     insulin degludec (TRESIBA FLEXTOUCH) 200 UNIT/ML FlexTouch Pen ADMINISTER 40 UNITS UNDER THE SKIN DAILY     lisinopril (PRINIVIL,ZESTRIL) 5 MG tablet Take 5 mg by mouth daily.     meloxicam (MOBIC) 7.5 MG tablet Take 1 tablet by mouth as needed.     metFORMIN (GLUCOPHAGE-XR) 500 MG 24 hr tablet TK 2 TS PO BID     Methotrexate, PF, 10 MG/0.4ML SOAJ Inject into the skin.     ondansetron (ZOFRAN) 8 MG tablet Take 1 tablet by mouth every 8 (eight) hours as needed.     budesonide-formoterol (SYMBICORT) 160-4.5 MCG/ACT inhaler Inhale 2 puffs into the lungs 2 (two) times daily.     No facility-administered medications prior to visit.         Objective:   Physical Exam Vitals:   12/01/21 1139  BP: 136/78  Pulse: 72  Temp: 98.6 F (37 C)  TempSrc: Oral  SpO2: 95%  Weight: 252 lb 6.4 oz (114.5 kg)  Height: '5\' 11"'$  (1.803 m)    Gen: Pleasant, well-nourished, in no distress,  normal affect  ENT: No lesions,  mouth clear,  oropharynx clear, no postnasal drip  Neck: No JVD, no stridor  Lungs: No use of accessory muscles, no crackles or wheezing on normal respiration, upper  airway cough on forced expiration but no wheezing  Cardiovascular: RRR, heart sounds normal, no murmur or gallops, no peripheral edema  Musculoskeletal: No deformities, no cyanosis or clubbing  Neuro: alert, awake, non focal  Skin: Warm, no lesions or rash       Assessment & Plan:  Pulmonary nodules/lesions, multiple Suspect that these are rheumatoid nodules based on stability.  I have stability for over 1 year and he believes he had previous CT scans that are not available for review at this time.  We will repeat in August 2024 to establish 2 full years of stability.  If no change at that time and if no new symptoms then we can probably  back off the frequency of surveillance.  Mild intermittent asthma Overall stable.  Rare albuterol use, about every 2 weeks.  No flares.  Plan to continue same.  Hold off on ICS/LABA for now.   Baltazar Apo, MD, PhD 12/01/2021, 11:55 AM [facility] [phone] or if no answer before 7:00PM call [phone] For any issues after 7:00PM please call eLink [phone]

## 2021-12-01 NOTE — Patient Instructions (Signed)
We reviewed your CT scan of the chest today. We will plan to repeat another CT scan of the chest without contrast in August 2024.  If that scan is stable then we can probably decrease the frequency with which we follow your pulmonary nodules. Keep your albuterol available to use 2 puffs if needed for shortness of breath, chest tightness, wheezing. Please let us know if you experience any new symptoms including fevers, sweats, chills, new respiratory symptoms Follow with Dr. Lamonte Sakai in August 2024 after your CT so we can review results together.  Call sooner if you have any problems.

## 2021-12-01 NOTE — Assessment & Plan Note (Signed)
Suspect that these are rheumatoid nodules based on stability.  I have stability for over 1 year and he believes he had previous CT scans that are not available for review at this time.  We will repeat in August 2024 to establish 2 full years of stability.  If no change at that time and if no new symptoms then we can probably back off the frequency of surveillance.

## 2021-12-20 DIAGNOSIS — B356 Tinea cruris: Secondary | ICD-10-CM | POA: Diagnosis not present

## 2021-12-20 DIAGNOSIS — Z6835 Body mass index (BMI) 35.0-35.9, adult: Secondary | ICD-10-CM | POA: Diagnosis not present

## 2021-12-22 DIAGNOSIS — Z23 Encounter for immunization: Secondary | ICD-10-CM | POA: Diagnosis not present

## 2022-01-19 DIAGNOSIS — M0579 Rheumatoid arthritis with rheumatoid factor of multiple sites without organ or systems involvement: Secondary | ICD-10-CM | POA: Diagnosis not present

## 2022-01-20 DIAGNOSIS — B356 Tinea cruris: Secondary | ICD-10-CM | POA: Diagnosis not present

## 2022-01-20 DIAGNOSIS — Z79899 Other long term (current) drug therapy: Secondary | ICD-10-CM | POA: Diagnosis not present

## 2022-01-20 DIAGNOSIS — Z6835 Body mass index (BMI) 35.0-35.9, adult: Secondary | ICD-10-CM | POA: Diagnosis not present

## 2022-02-03 DIAGNOSIS — Z7984 Long term (current) use of oral hypoglycemic drugs: Secondary | ICD-10-CM | POA: Diagnosis not present

## 2022-02-03 DIAGNOSIS — E1140 Type 2 diabetes mellitus with diabetic neuropathy, unspecified: Secondary | ICD-10-CM | POA: Diagnosis not present

## 2022-02-03 DIAGNOSIS — Z794 Long term (current) use of insulin: Secondary | ICD-10-CM | POA: Diagnosis not present

## 2022-02-16 DIAGNOSIS — L209 Atopic dermatitis, unspecified: Secondary | ICD-10-CM | POA: Diagnosis not present

## 2022-02-16 DIAGNOSIS — L299 Pruritus, unspecified: Secondary | ICD-10-CM | POA: Diagnosis not present

## 2022-03-16 DIAGNOSIS — M0579 Rheumatoid arthritis with rheumatoid factor of multiple sites without organ or systems involvement: Secondary | ICD-10-CM | POA: Diagnosis not present

## 2022-03-30 DIAGNOSIS — L299 Pruritus, unspecified: Secondary | ICD-10-CM | POA: Diagnosis not present

## 2022-03-30 DIAGNOSIS — L209 Atopic dermatitis, unspecified: Secondary | ICD-10-CM | POA: Diagnosis not present

## 2022-03-30 DIAGNOSIS — B372 Candidiasis of skin and nail: Secondary | ICD-10-CM | POA: Diagnosis not present

## 2022-04-22 DIAGNOSIS — E1169 Type 2 diabetes mellitus with other specified complication: Secondary | ICD-10-CM | POA: Diagnosis not present

## 2022-04-22 DIAGNOSIS — M069 Rheumatoid arthritis, unspecified: Secondary | ICD-10-CM | POA: Diagnosis not present

## 2022-04-22 DIAGNOSIS — Z6835 Body mass index (BMI) 35.0-35.9, adult: Secondary | ICD-10-CM | POA: Diagnosis not present

## 2022-04-27 DIAGNOSIS — Z794 Long term (current) use of insulin: Secondary | ICD-10-CM | POA: Diagnosis not present

## 2022-04-27 DIAGNOSIS — M25521 Pain in right elbow: Secondary | ICD-10-CM | POA: Diagnosis not present

## 2022-04-27 DIAGNOSIS — Z6834 Body mass index (BMI) 34.0-34.9, adult: Secondary | ICD-10-CM | POA: Diagnosis not present

## 2022-04-27 DIAGNOSIS — B372 Candidiasis of skin and nail: Secondary | ICD-10-CM | POA: Diagnosis not present

## 2022-04-27 DIAGNOSIS — M0579 Rheumatoid arthritis with rheumatoid factor of multiple sites without organ or systems involvement: Secondary | ICD-10-CM | POA: Diagnosis not present

## 2022-04-27 DIAGNOSIS — M1991 Primary osteoarthritis, unspecified site: Secondary | ICD-10-CM | POA: Diagnosis not present

## 2022-04-27 DIAGNOSIS — E669 Obesity, unspecified: Secondary | ICD-10-CM | POA: Diagnosis not present

## 2022-04-27 DIAGNOSIS — E119 Type 2 diabetes mellitus without complications: Secondary | ICD-10-CM | POA: Diagnosis not present

## 2022-04-27 DIAGNOSIS — G629 Polyneuropathy, unspecified: Secondary | ICD-10-CM | POA: Diagnosis not present

## 2022-04-28 DIAGNOSIS — L209 Atopic dermatitis, unspecified: Secondary | ICD-10-CM | POA: Diagnosis not present

## 2022-04-28 DIAGNOSIS — L299 Pruritus, unspecified: Secondary | ICD-10-CM | POA: Diagnosis not present

## 2022-05-11 DIAGNOSIS — M0579 Rheumatoid arthritis with rheumatoid factor of multiple sites without organ or systems involvement: Secondary | ICD-10-CM | POA: Diagnosis not present

## 2022-07-06 DIAGNOSIS — M0579 Rheumatoid arthritis with rheumatoid factor of multiple sites without organ or systems involvement: Secondary | ICD-10-CM | POA: Diagnosis not present

## 2022-08-04 DIAGNOSIS — Z794 Long term (current) use of insulin: Secondary | ICD-10-CM | POA: Diagnosis not present

## 2022-08-04 DIAGNOSIS — E1140 Type 2 diabetes mellitus with diabetic neuropathy, unspecified: Secondary | ICD-10-CM | POA: Diagnosis not present

## 2022-08-04 DIAGNOSIS — Z7984 Long term (current) use of oral hypoglycemic drugs: Secondary | ICD-10-CM | POA: Diagnosis not present

## 2022-08-31 DIAGNOSIS — M0579 Rheumatoid arthritis with rheumatoid factor of multiple sites without organ or systems involvement: Secondary | ICD-10-CM | POA: Diagnosis not present

## 2022-09-09 DIAGNOSIS — L218 Other seborrheic dermatitis: Secondary | ICD-10-CM | POA: Diagnosis not present

## 2022-09-09 DIAGNOSIS — L570 Actinic keratosis: Secondary | ICD-10-CM | POA: Diagnosis not present

## 2022-09-09 DIAGNOSIS — L814 Other melanin hyperpigmentation: Secondary | ICD-10-CM | POA: Diagnosis not present

## 2022-09-09 DIAGNOSIS — Z85828 Personal history of other malignant neoplasm of skin: Secondary | ICD-10-CM | POA: Diagnosis not present

## 2022-09-09 DIAGNOSIS — L821 Other seborrheic keratosis: Secondary | ICD-10-CM | POA: Diagnosis not present

## 2022-09-09 DIAGNOSIS — L304 Erythema intertrigo: Secondary | ICD-10-CM | POA: Diagnosis not present

## 2022-09-21 DIAGNOSIS — R001 Bradycardia, unspecified: Secondary | ICD-10-CM | POA: Diagnosis not present

## 2022-09-21 DIAGNOSIS — E669 Obesity, unspecified: Secondary | ICD-10-CM | POA: Diagnosis not present

## 2022-09-21 DIAGNOSIS — I10 Essential (primary) hypertension: Secondary | ICD-10-CM | POA: Diagnosis not present

## 2022-10-25 DIAGNOSIS — E1169 Type 2 diabetes mellitus with other specified complication: Secondary | ICD-10-CM | POA: Diagnosis not present

## 2022-10-25 DIAGNOSIS — J449 Chronic obstructive pulmonary disease, unspecified: Secondary | ICD-10-CM | POA: Diagnosis not present

## 2022-10-25 DIAGNOSIS — M5416 Radiculopathy, lumbar region: Secondary | ICD-10-CM | POA: Diagnosis not present

## 2022-10-25 DIAGNOSIS — M542 Cervicalgia: Secondary | ICD-10-CM | POA: Diagnosis not present

## 2022-10-25 DIAGNOSIS — Z0000 Encounter for general adult medical examination without abnormal findings: Secondary | ICD-10-CM | POA: Diagnosis not present

## 2022-10-25 DIAGNOSIS — M069 Rheumatoid arthritis, unspecified: Secondary | ICD-10-CM | POA: Diagnosis not present

## 2022-10-26 DIAGNOSIS — H2513 Age-related nuclear cataract, bilateral: Secondary | ICD-10-CM | POA: Diagnosis not present

## 2022-10-26 DIAGNOSIS — E119 Type 2 diabetes mellitus without complications: Secondary | ICD-10-CM | POA: Diagnosis not present

## 2022-10-27 DIAGNOSIS — M0579 Rheumatoid arthritis with rheumatoid factor of multiple sites without organ or systems involvement: Secondary | ICD-10-CM | POA: Diagnosis not present

## 2022-10-27 DIAGNOSIS — R5383 Other fatigue: Secondary | ICD-10-CM | POA: Diagnosis not present

## 2022-10-27 DIAGNOSIS — Z79899 Other long term (current) drug therapy: Secondary | ICD-10-CM | POA: Diagnosis not present

## 2022-11-02 ENCOUNTER — Encounter (HOSPITAL_BASED_OUTPATIENT_CLINIC_OR_DEPARTMENT_OTHER): Payer: Self-pay

## 2022-11-02 ENCOUNTER — Ambulatory Visit (HOSPITAL_BASED_OUTPATIENT_CLINIC_OR_DEPARTMENT_OTHER)
Admission: RE | Admit: 2022-11-02 | Discharge: 2022-11-02 | Disposition: A | Discharge status: Home / Self Care | Payer: Medicare Other | Source: Ambulatory Visit | Attending: Emergency Medicine | Admitting: Emergency Medicine

## 2022-11-02 DIAGNOSIS — R918 Other nonspecific abnormal finding of lung field: Secondary | ICD-10-CM

## 2022-11-02 DIAGNOSIS — R590 Localized enlarged lymph nodes: Secondary | ICD-10-CM | POA: Diagnosis not present

## 2022-11-02 DIAGNOSIS — G629 Polyneuropathy, unspecified: Secondary | ICD-10-CM | POA: Diagnosis not present

## 2022-11-02 DIAGNOSIS — I77810 Thoracic aortic ectasia: Secondary | ICD-10-CM | POA: Diagnosis not present

## 2022-11-02 DIAGNOSIS — E119 Type 2 diabetes mellitus without complications: Secondary | ICD-10-CM | POA: Diagnosis not present

## 2022-11-02 DIAGNOSIS — E669 Obesity, unspecified: Secondary | ICD-10-CM | POA: Diagnosis not present

## 2022-11-02 DIAGNOSIS — M7989 Other specified soft tissue disorders: Secondary | ICD-10-CM | POA: Diagnosis not present

## 2022-11-02 DIAGNOSIS — Z794 Long term (current) use of insulin: Secondary | ICD-10-CM | POA: Diagnosis not present

## 2022-11-02 DIAGNOSIS — M0579 Rheumatoid arthritis with rheumatoid factor of multiple sites without organ or systems involvement: Secondary | ICD-10-CM | POA: Diagnosis not present

## 2022-11-02 DIAGNOSIS — B372 Candidiasis of skin and nail: Secondary | ICD-10-CM | POA: Diagnosis not present

## 2022-11-02 DIAGNOSIS — M25521 Pain in right elbow: Secondary | ICD-10-CM | POA: Diagnosis not present

## 2022-11-02 DIAGNOSIS — M1991 Primary osteoarthritis, unspecified site: Secondary | ICD-10-CM | POA: Diagnosis not present

## 2022-11-02 DIAGNOSIS — Z6833 Body mass index (BMI) 33.0-33.9, adult: Secondary | ICD-10-CM | POA: Diagnosis not present

## 2022-11-09 DIAGNOSIS — M5116 Intervertebral disc disorders with radiculopathy, lumbar region: Secondary | ICD-10-CM | POA: Diagnosis not present

## 2022-11-09 DIAGNOSIS — Z6832 Body mass index (BMI) 32.0-32.9, adult: Secondary | ICD-10-CM | POA: Diagnosis not present

## 2022-11-15 DIAGNOSIS — M5136 Other intervertebral disc degeneration, lumbar region: Secondary | ICD-10-CM | POA: Diagnosis not present

## 2022-11-15 DIAGNOSIS — M4807 Spinal stenosis, lumbosacral region: Secondary | ICD-10-CM | POA: Diagnosis not present

## 2022-11-15 DIAGNOSIS — M48061 Spinal stenosis, lumbar region without neurogenic claudication: Secondary | ICD-10-CM | POA: Diagnosis not present

## 2022-11-15 DIAGNOSIS — M4808 Spinal stenosis, sacral and sacrococcygeal region: Secondary | ICD-10-CM | POA: Diagnosis not present

## 2022-11-15 DIAGNOSIS — M5116 Intervertebral disc disorders with radiculopathy, lumbar region: Secondary | ICD-10-CM | POA: Diagnosis not present

## 2022-12-08 ENCOUNTER — Encounter: Payer: Self-pay | Admitting: Emergency Medicine

## 2022-12-08 ENCOUNTER — Ambulatory Visit (INDEPENDENT_AMBULATORY_CARE_PROVIDER_SITE_OTHER): Payer: Medicare Other | Admitting: Emergency Medicine

## 2022-12-08 VITALS — BP 126/64 | HR 60 | Temp 97.8°F | Ht 71.0 in | Wt 236.2 lb

## 2022-12-08 DIAGNOSIS — J4520 Mild intermittent asthma, uncomplicated: Secondary | ICD-10-CM | POA: Diagnosis not present

## 2022-12-08 DIAGNOSIS — R918 Other nonspecific abnormal finding of lung field: Secondary | ICD-10-CM

## 2022-12-08 NOTE — Assessment & Plan Note (Signed)
Stable pulmonary nodules consistent with rheumatoid nodules.  They have been stable for over 2 years.  I do not think we need to arrange for dedicated follow-up unless there is a clinical change.

## 2022-12-08 NOTE — Progress Notes (Signed)
Subjective:    Patient ID: Harry Burton, male    DOB: 1945-10-03, 78 y.o.   MRN: [id number]  HPI  ROV 12/01/21 --pleasant 77 year old gentleman, never smoker with a history of rheumatoid arthritis on Remicade and methotrexate.  He also has asthma with mild obstructive disease.  We have been following scattered pulmonary nodules most consistent with rheumatoid nodules in absence of any opportunistic infection.  He has been undergoing serial CT imaging to ensure stability. Today he reports  CT scan of the chest 10/26/2021 reviewed by me, shows unchanged right hemidiaphragmatic elevation with some associated atelectasis.  Multiple unchanged small bilateral pulmonary nodules up to 1.1 x 0.8 cm in the superior segment of the left lower lobe.  All unchanged   ROV 12/08/2022 --follow-up visit 77 year old gentleman, never smoker with a history of rheumatoid arthritis on Remicade and methotrexate. He is planning to come off Remicade and to start an alternative.  I have followed him for pulmonary nodular disease on chest imaging as well as asthma/mild obstructive disease.  He has been on Symbicort in the past, not currently.  Has albuterol which he uses very rarely. Today he reports that he is doing well. No flares. No pred.   CT chest 11/02/2022 reviewed by me, shows stable pulmonary nodules dating back to 06/01/2020, largest 1 x 0.6 cm in the posterior left lower lobe.  He also has a dilated ascending thoracic aorta that will need annual follow-up.   Review of Systems As per HPI   Allergies  Allergen Reactions   Penicillin G Rash     Outpatient Medications Prior to Visit  Medication Sig Dispense Refill   albuterol (VENTOLIN HFA) 108 (90 Base) MCG/ACT inhaler Inhale 2 puffs into the lungs every 6 (six) hours as needed. 18 g 3   ASPIRIN 81 PO Take 81 mg by mouth daily.     Calcium Carb-Cholecalciferol 1000-800 MG-UNIT TABS Take 1 tablet by mouth daily.     Cholecalciferol (VITAMIN D3) 125 MCG (5000  UT) CAPS Take 1 capsule by mouth daily.     Cyanocobalamin (B-12 PO) Take 5,000 mcg by mouth daily.     folic acid (FOLVITE) 1 MG tablet Take by mouth.     inFLIXimab (REMICADE IV) Inject 600 mg into the vein every 8 (eight) weeks.     insulin degludec (TRESIBA FLEXTOUCH) 200 UNIT/ML FlexTouch Pen 26 Units daily.     lisinopril (PRINIVIL,ZESTRIL) 5 MG tablet Take 5 mg by mouth daily.     metFORMIN (GLUCOPHAGE-XR) 500 MG 24 hr tablet 2 tablets 2 (two) times daily with a meal.     Methotrexate, PF, 10 MG/0.4ML SOAJ Inject 6 Units into the skin once a week.     budesonide-formoterol (SYMBICORT) 160-4.5 MCG/ACT inhaler Inhale 2 puffs into the lungs 2 (two) times daily. (Patient not taking: Reported on 12/08/2022)     meloxicam (MOBIC) 7.5 MG tablet Take 1 tablet by mouth as needed. (Patient not taking: Reported on 12/08/2022)     ondansetron (ZOFRAN) 8 MG tablet Take 1 tablet by mouth every 8 (eight) hours as needed. (Patient not taking: Reported on 12/08/2022)     No facility-administered medications prior to visit.         Objective:   Physical Exam Vitals:   12/08/22 1122  BP: 126/64  Pulse: 60  Temp: 97.8 F (36.6 C)  TempSrc: Oral  SpO2: 97%  Weight: 236 lb 3.2 oz (107.1 kg)  Height: 5\' 11"  (1.803 m)    Gen:  Pleasant, well-nourished, in no distress,  normal affect  ENT: No lesions,  mouth clear,  oropharynx clear, no postnasal drip  Neck: No JVD, no stridor  Lungs: No use of accessory muscles, no crackles or wheezing on normal respiration, upper airway cough on forced expiration but no wheezing  Cardiovascular: RRR, heart sounds normal, no murmur or gallops, no peripheral edema  Musculoskeletal: No deformities, no cyanosis or clubbing  Neuro: alert, awake, non focal  Skin: Warm, no lesions or rash       Assessment & Plan:  Pulmonary nodules/lesions, multiple Stable pulmonary nodules consistent with rheumatoid nodules.  They have been stable for over 2 years.  I do  not think we need to arrange for dedicated follow-up unless there is a clinical change.  Mild intermittent asthma Keep your albuterol available to use 2 puffs when needed for shortness of breath, chest tightness, wheezing. We will hold off on starting any scheduled inhaler medication for now. Get the flu shot this fall Recommend that you consider getting the COVID-19 vaccine and the RSV vaccine this fall Your pneumococcal pneumonia vaccine is up-to-date Follow Dr. Delton Coombes in 1 year, call sooner if you have any problems.    Levy Pupa, MD, PhD 12/08/2022, 11:55 AM  [facility] [phone] or if no answer before 7:00PM call [phone] For any issues after 7:00PM please call eLink [phone]

## 2022-12-08 NOTE — Assessment & Plan Note (Signed)
Keep your albuterol available to use 2 puffs when needed for shortness of breath, chest tightness, wheezing. We will hold off on starting any scheduled inhaler medication for now. Get the flu shot this fall Recommend that you consider getting the COVID-19 vaccine and the RSV vaccine this fall Your pneumococcal pneumonia vaccine is up-to-date Follow Dr. Delton Coombes in 1 year, call sooner if you have any problems.

## 2022-12-08 NOTE — Patient Instructions (Signed)
We reviewed your CT scan of the chest today.  Your pulmonary nodules are stable.  Good news.  We can discuss the timing of any repeat scans in the future depending on how you are doing clinically.  We do not have to schedule one right now. Keep your albuterol available to use 2 puffs when needed for shortness of breath, chest tightness, wheezing. We will hold off on starting any scheduled inhaler medication for now. Get the flu shot this fall Recommend that you consider getting the COVID-19 vaccine and the RSV vaccine this fall Your pneumococcal pneumonia vaccine is up-to-date Follow Dr. Delton Coombes in 1 year, call sooner if you have any problems.

## 2022-12-22 DIAGNOSIS — R5383 Other fatigue: Secondary | ICD-10-CM | POA: Diagnosis not present

## 2022-12-22 DIAGNOSIS — Z111 Encounter for screening for respiratory tuberculosis: Secondary | ICD-10-CM | POA: Diagnosis not present

## 2022-12-22 DIAGNOSIS — Z79899 Other long term (current) drug therapy: Secondary | ICD-10-CM | POA: Diagnosis not present

## 2022-12-22 DIAGNOSIS — M0579 Rheumatoid arthritis with rheumatoid factor of multiple sites without organ or systems involvement: Secondary | ICD-10-CM | POA: Diagnosis not present

## 2023-01-19 DIAGNOSIS — M0579 Rheumatoid arthritis with rheumatoid factor of multiple sites without organ or systems involvement: Secondary | ICD-10-CM | POA: Diagnosis not present

## 2023-01-19 DIAGNOSIS — Z79899 Other long term (current) drug therapy: Secondary | ICD-10-CM | POA: Diagnosis not present

## 2023-01-31 DIAGNOSIS — Z23 Encounter for immunization: Secondary | ICD-10-CM | POA: Diagnosis not present

## 2023-02-07 DIAGNOSIS — I10 Essential (primary) hypertension: Secondary | ICD-10-CM | POA: Diagnosis not present

## 2023-02-07 DIAGNOSIS — Z7984 Long term (current) use of oral hypoglycemic drugs: Secondary | ICD-10-CM | POA: Diagnosis not present

## 2023-02-07 DIAGNOSIS — E669 Obesity, unspecified: Secondary | ICD-10-CM | POA: Diagnosis not present

## 2023-02-07 DIAGNOSIS — E1140 Type 2 diabetes mellitus with diabetic neuropathy, unspecified: Secondary | ICD-10-CM | POA: Diagnosis not present

## 2023-02-07 DIAGNOSIS — Z794 Long term (current) use of insulin: Secondary | ICD-10-CM | POA: Diagnosis not present

## 2023-02-08 DIAGNOSIS — M48062 Spinal stenosis, lumbar region with neurogenic claudication: Secondary | ICD-10-CM | POA: Diagnosis not present

## 2023-02-08 DIAGNOSIS — Z6833 Body mass index (BMI) 33.0-33.9, adult: Secondary | ICD-10-CM | POA: Diagnosis not present

## 2023-02-09 DIAGNOSIS — M2041 Other hammer toe(s) (acquired), right foot: Secondary | ICD-10-CM | POA: Diagnosis not present

## 2023-02-09 DIAGNOSIS — M2042 Other hammer toe(s) (acquired), left foot: Secondary | ICD-10-CM | POA: Diagnosis not present

## 2023-02-13 DIAGNOSIS — E669 Obesity, unspecified: Secondary | ICD-10-CM | POA: Diagnosis not present

## 2023-02-13 DIAGNOSIS — E119 Type 2 diabetes mellitus without complications: Secondary | ICD-10-CM | POA: Diagnosis not present

## 2023-02-13 DIAGNOSIS — M1991 Primary osteoarthritis, unspecified site: Secondary | ICD-10-CM | POA: Diagnosis not present

## 2023-02-13 DIAGNOSIS — M4316 Spondylolisthesis, lumbar region: Secondary | ICD-10-CM | POA: Diagnosis not present

## 2023-02-13 DIAGNOSIS — G629 Polyneuropathy, unspecified: Secondary | ICD-10-CM | POA: Diagnosis not present

## 2023-02-13 DIAGNOSIS — B372 Candidiasis of skin and nail: Secondary | ICD-10-CM | POA: Diagnosis not present

## 2023-02-13 DIAGNOSIS — M0579 Rheumatoid arthritis with rheumatoid factor of multiple sites without organ or systems involvement: Secondary | ICD-10-CM | POA: Diagnosis not present

## 2023-02-13 DIAGNOSIS — Z6833 Body mass index (BMI) 33.0-33.9, adult: Secondary | ICD-10-CM | POA: Diagnosis not present

## 2023-02-13 DIAGNOSIS — M48062 Spinal stenosis, lumbar region with neurogenic claudication: Secondary | ICD-10-CM | POA: Diagnosis not present

## 2023-02-13 DIAGNOSIS — M25521 Pain in right elbow: Secondary | ICD-10-CM | POA: Diagnosis not present

## 2023-02-13 DIAGNOSIS — Z794 Long term (current) use of insulin: Secondary | ICD-10-CM | POA: Diagnosis not present

## 2023-03-16 DIAGNOSIS — M0579 Rheumatoid arthritis with rheumatoid factor of multiple sites without organ or systems involvement: Secondary | ICD-10-CM | POA: Diagnosis not present

## 2023-03-16 DIAGNOSIS — Z79899 Other long term (current) drug therapy: Secondary | ICD-10-CM | POA: Diagnosis not present

## 2023-04-17 DIAGNOSIS — M25562 Pain in left knee: Secondary | ICD-10-CM | POA: Diagnosis not present

## 2023-04-19 DIAGNOSIS — M4316 Spondylolisthesis, lumbar region: Secondary | ICD-10-CM | POA: Diagnosis not present

## 2023-04-19 DIAGNOSIS — M5116 Intervertebral disc disorders with radiculopathy, lumbar region: Secondary | ICD-10-CM | POA: Diagnosis not present

## 2023-04-19 DIAGNOSIS — Z6834 Body mass index (BMI) 34.0-34.9, adult: Secondary | ICD-10-CM | POA: Diagnosis not present

## 2023-04-19 DIAGNOSIS — M48062 Spinal stenosis, lumbar region with neurogenic claudication: Secondary | ICD-10-CM | POA: Diagnosis not present

## 2023-04-21 DIAGNOSIS — R509 Fever, unspecified: Secondary | ICD-10-CM | POA: Diagnosis not present

## 2023-04-21 DIAGNOSIS — R051 Acute cough: Secondary | ICD-10-CM | POA: Diagnosis not present

## 2023-04-27 DIAGNOSIS — E1169 Type 2 diabetes mellitus with other specified complication: Secondary | ICD-10-CM | POA: Diagnosis not present

## 2023-04-27 DIAGNOSIS — I700 Atherosclerosis of aorta: Secondary | ICD-10-CM | POA: Diagnosis not present

## 2023-04-27 DIAGNOSIS — M069 Rheumatoid arthritis, unspecified: Secondary | ICD-10-CM | POA: Diagnosis not present

## 2023-05-11 DIAGNOSIS — M0579 Rheumatoid arthritis with rheumatoid factor of multiple sites without organ or systems involvement: Secondary | ICD-10-CM | POA: Diagnosis not present

## 2023-05-11 DIAGNOSIS — Z79899 Other long term (current) drug therapy: Secondary | ICD-10-CM | POA: Diagnosis not present

## 2023-05-24 DIAGNOSIS — M0579 Rheumatoid arthritis with rheumatoid factor of multiple sites without organ or systems involvement: Secondary | ICD-10-CM | POA: Diagnosis not present

## 2023-05-24 DIAGNOSIS — Z794 Long term (current) use of insulin: Secondary | ICD-10-CM | POA: Diagnosis not present

## 2023-05-24 DIAGNOSIS — G629 Polyneuropathy, unspecified: Secondary | ICD-10-CM | POA: Diagnosis not present

## 2023-05-24 DIAGNOSIS — Z6833 Body mass index (BMI) 33.0-33.9, adult: Secondary | ICD-10-CM | POA: Diagnosis not present

## 2023-05-24 DIAGNOSIS — M1991 Primary osteoarthritis, unspecified site: Secondary | ICD-10-CM | POA: Diagnosis not present

## 2023-05-24 DIAGNOSIS — B372 Candidiasis of skin and nail: Secondary | ICD-10-CM | POA: Diagnosis not present

## 2023-05-24 DIAGNOSIS — E669 Obesity, unspecified: Secondary | ICD-10-CM | POA: Diagnosis not present

## 2023-07-07 DIAGNOSIS — Z79899 Other long term (current) drug therapy: Secondary | ICD-10-CM | POA: Diagnosis not present

## 2023-07-07 DIAGNOSIS — M0579 Rheumatoid arthritis with rheumatoid factor of multiple sites without organ or systems involvement: Secondary | ICD-10-CM | POA: Diagnosis not present

## 2023-07-24 DIAGNOSIS — M069 Rheumatoid arthritis, unspecified: Secondary | ICD-10-CM | POA: Diagnosis not present

## 2023-07-24 DIAGNOSIS — E1169 Type 2 diabetes mellitus with other specified complication: Secondary | ICD-10-CM | POA: Diagnosis not present

## 2023-07-24 DIAGNOSIS — E1159 Type 2 diabetes mellitus with other circulatory complications: Secondary | ICD-10-CM | POA: Diagnosis not present

## 2023-07-26 DIAGNOSIS — N1831 Chronic kidney disease, stage 3a: Secondary | ICD-10-CM | POA: Diagnosis not present

## 2023-08-08 DIAGNOSIS — Z794 Long term (current) use of insulin: Secondary | ICD-10-CM | POA: Diagnosis not present

## 2023-08-08 DIAGNOSIS — Z6833 Body mass index (BMI) 33.0-33.9, adult: Secondary | ICD-10-CM | POA: Diagnosis not present

## 2023-08-08 DIAGNOSIS — I10 Essential (primary) hypertension: Secondary | ICD-10-CM | POA: Diagnosis not present

## 2023-08-08 DIAGNOSIS — E669 Obesity, unspecified: Secondary | ICD-10-CM | POA: Diagnosis not present

## 2023-08-08 DIAGNOSIS — E1140 Type 2 diabetes mellitus with diabetic neuropathy, unspecified: Secondary | ICD-10-CM | POA: Diagnosis not present

## 2023-08-08 DIAGNOSIS — Z7984 Long term (current) use of oral hypoglycemic drugs: Secondary | ICD-10-CM | POA: Diagnosis not present

## 2023-09-04 DIAGNOSIS — M0579 Rheumatoid arthritis with rheumatoid factor of multiple sites without organ or systems involvement: Secondary | ICD-10-CM | POA: Diagnosis not present

## 2023-09-04 DIAGNOSIS — Z79899 Other long term (current) drug therapy: Secondary | ICD-10-CM | POA: Diagnosis not present

## 2023-09-11 DIAGNOSIS — L570 Actinic keratosis: Secondary | ICD-10-CM | POA: Diagnosis not present

## 2023-09-11 DIAGNOSIS — L820 Inflamed seborrheic keratosis: Secondary | ICD-10-CM | POA: Diagnosis not present

## 2023-09-11 DIAGNOSIS — D225 Melanocytic nevi of trunk: Secondary | ICD-10-CM | POA: Diagnosis not present

## 2023-09-11 DIAGNOSIS — Z85828 Personal history of other malignant neoplasm of skin: Secondary | ICD-10-CM | POA: Diagnosis not present

## 2023-09-11 DIAGNOSIS — L814 Other melanin hyperpigmentation: Secondary | ICD-10-CM | POA: Diagnosis not present

## 2023-09-11 DIAGNOSIS — L821 Other seborrheic keratosis: Secondary | ICD-10-CM | POA: Diagnosis not present

## 2023-09-11 DIAGNOSIS — D485 Neoplasm of uncertain behavior of skin: Secondary | ICD-10-CM | POA: Diagnosis not present

## 2023-09-19 DIAGNOSIS — M25521 Pain in right elbow: Secondary | ICD-10-CM | POA: Diagnosis not present

## 2023-09-19 DIAGNOSIS — M0579 Rheumatoid arthritis with rheumatoid factor of multiple sites without organ or systems involvement: Secondary | ICD-10-CM | POA: Diagnosis not present

## 2023-09-19 DIAGNOSIS — M1991 Primary osteoarthritis, unspecified site: Secondary | ICD-10-CM | POA: Diagnosis not present

## 2023-09-19 DIAGNOSIS — Z6832 Body mass index (BMI) 32.0-32.9, adult: Secondary | ICD-10-CM | POA: Diagnosis not present

## 2023-09-19 DIAGNOSIS — B372 Candidiasis of skin and nail: Secondary | ICD-10-CM | POA: Diagnosis not present

## 2023-09-19 DIAGNOSIS — E669 Obesity, unspecified: Secondary | ICD-10-CM | POA: Diagnosis not present

## 2023-09-19 DIAGNOSIS — Z794 Long term (current) use of insulin: Secondary | ICD-10-CM | POA: Diagnosis not present

## 2023-09-19 DIAGNOSIS — G629 Polyneuropathy, unspecified: Secondary | ICD-10-CM | POA: Diagnosis not present

## 2023-09-20 DIAGNOSIS — R001 Bradycardia, unspecified: Secondary | ICD-10-CM | POA: Diagnosis not present

## 2023-09-20 DIAGNOSIS — I498 Other specified cardiac arrhythmias: Secondary | ICD-10-CM | POA: Diagnosis not present

## 2023-09-26 DIAGNOSIS — R001 Bradycardia, unspecified: Secondary | ICD-10-CM | POA: Diagnosis not present

## 2023-10-11 DIAGNOSIS — I491 Atrial premature depolarization: Secondary | ICD-10-CM | POA: Diagnosis not present

## 2023-10-11 DIAGNOSIS — I471 Supraventricular tachycardia, unspecified: Secondary | ICD-10-CM | POA: Diagnosis not present

## 2023-10-11 DIAGNOSIS — I493 Ventricular premature depolarization: Secondary | ICD-10-CM | POA: Diagnosis not present

## 2023-10-11 DIAGNOSIS — R001 Bradycardia, unspecified: Secondary | ICD-10-CM | POA: Diagnosis not present

## 2023-10-16 DIAGNOSIS — I10 Essential (primary) hypertension: Secondary | ICD-10-CM | POA: Diagnosis not present

## 2023-10-16 DIAGNOSIS — R001 Bradycardia, unspecified: Secondary | ICD-10-CM | POA: Diagnosis not present

## 2023-10-27 DIAGNOSIS — E1142 Type 2 diabetes mellitus with diabetic polyneuropathy: Secondary | ICD-10-CM | POA: Diagnosis not present

## 2023-10-27 DIAGNOSIS — Z794 Long term (current) use of insulin: Secondary | ICD-10-CM | POA: Diagnosis not present

## 2023-10-27 DIAGNOSIS — E6609 Other obesity due to excess calories: Secondary | ICD-10-CM | POA: Diagnosis not present

## 2023-10-27 DIAGNOSIS — R9439 Abnormal result of other cardiovascular function study: Secondary | ICD-10-CM | POA: Diagnosis not present

## 2023-10-27 DIAGNOSIS — I10 Essential (primary) hypertension: Secondary | ICD-10-CM | POA: Diagnosis not present

## 2023-10-27 DIAGNOSIS — E66811 Obesity, class 1: Secondary | ICD-10-CM | POA: Diagnosis not present

## 2023-10-27 DIAGNOSIS — Z6833 Body mass index (BMI) 33.0-33.9, adult: Secondary | ICD-10-CM | POA: Diagnosis not present

## 2023-10-27 DIAGNOSIS — R001 Bradycardia, unspecified: Secondary | ICD-10-CM | POA: Diagnosis not present

## 2023-10-30 DIAGNOSIS — Z0000 Encounter for general adult medical examination without abnormal findings: Secondary | ICD-10-CM | POA: Diagnosis not present

## 2023-10-30 DIAGNOSIS — N1831 Chronic kidney disease, stage 3a: Secondary | ICD-10-CM | POA: Diagnosis not present

## 2023-10-30 DIAGNOSIS — Z79899 Other long term (current) drug therapy: Secondary | ICD-10-CM | POA: Diagnosis not present

## 2023-10-30 DIAGNOSIS — E1169 Type 2 diabetes mellitus with other specified complication: Secondary | ICD-10-CM | POA: Diagnosis not present

## 2023-10-30 DIAGNOSIS — M069 Rheumatoid arthritis, unspecified: Secondary | ICD-10-CM | POA: Diagnosis not present

## 2023-11-02 DIAGNOSIS — Z79899 Other long term (current) drug therapy: Secondary | ICD-10-CM | POA: Diagnosis not present

## 2023-11-02 DIAGNOSIS — M0579 Rheumatoid arthritis with rheumatoid factor of multiple sites without organ or systems involvement: Secondary | ICD-10-CM | POA: Diagnosis not present

## 2023-11-03 DIAGNOSIS — H2513 Age-related nuclear cataract, bilateral: Secondary | ICD-10-CM | POA: Diagnosis not present

## 2023-11-03 DIAGNOSIS — E119 Type 2 diabetes mellitus without complications: Secondary | ICD-10-CM | POA: Diagnosis not present

## 2023-11-16 DIAGNOSIS — M0579 Rheumatoid arthritis with rheumatoid factor of multiple sites without organ or systems involvement: Secondary | ICD-10-CM | POA: Diagnosis not present

## 2023-11-30 DIAGNOSIS — M0579 Rheumatoid arthritis with rheumatoid factor of multiple sites without organ or systems involvement: Secondary | ICD-10-CM | POA: Diagnosis not present

## 2023-12-28 DIAGNOSIS — M0579 Rheumatoid arthritis with rheumatoid factor of multiple sites without organ or systems involvement: Secondary | ICD-10-CM | POA: Diagnosis not present

## 2024-01-18 DIAGNOSIS — R001 Bradycardia, unspecified: Secondary | ICD-10-CM | POA: Diagnosis not present

## 2024-01-18 DIAGNOSIS — G4733 Obstructive sleep apnea (adult) (pediatric): Secondary | ICD-10-CM | POA: Diagnosis not present

## 2024-01-18 DIAGNOSIS — I10 Essential (primary) hypertension: Secondary | ICD-10-CM | POA: Diagnosis not present

## 2024-01-18 DIAGNOSIS — I700 Atherosclerosis of aorta: Secondary | ICD-10-CM | POA: Diagnosis not present

## 2024-01-18 DIAGNOSIS — R9439 Abnormal result of other cardiovascular function study: Secondary | ICD-10-CM | POA: Diagnosis not present

## 2024-01-18 DIAGNOSIS — Z794 Long term (current) use of insulin: Secondary | ICD-10-CM | POA: Diagnosis not present

## 2024-01-18 DIAGNOSIS — E1142 Type 2 diabetes mellitus with diabetic polyneuropathy: Secondary | ICD-10-CM | POA: Diagnosis not present

## 2024-01-25 DIAGNOSIS — R5383 Other fatigue: Secondary | ICD-10-CM | POA: Diagnosis not present

## 2024-01-25 DIAGNOSIS — M0579 Rheumatoid arthritis with rheumatoid factor of multiple sites without organ or systems involvement: Secondary | ICD-10-CM | POA: Diagnosis not present

## 2024-01-25 DIAGNOSIS — Z79899 Other long term (current) drug therapy: Secondary | ICD-10-CM | POA: Diagnosis not present

## 2024-01-31 DIAGNOSIS — I517 Cardiomegaly: Secondary | ICD-10-CM | POA: Diagnosis not present

## 2024-01-31 DIAGNOSIS — I358 Other nonrheumatic aortic valve disorders: Secondary | ICD-10-CM | POA: Diagnosis not present

## 2024-02-05 DIAGNOSIS — G4733 Obstructive sleep apnea (adult) (pediatric): Secondary | ICD-10-CM | POA: Diagnosis not present

## 2024-02-05 DIAGNOSIS — R9439 Abnormal result of other cardiovascular function study: Secondary | ICD-10-CM | POA: Diagnosis not present

## 2024-02-05 DIAGNOSIS — R001 Bradycardia, unspecified: Secondary | ICD-10-CM | POA: Diagnosis not present

## 2024-02-06 DIAGNOSIS — Z1331 Encounter for screening for depression: Secondary | ICD-10-CM | POA: Diagnosis not present

## 2024-02-06 DIAGNOSIS — E1142 Type 2 diabetes mellitus with diabetic polyneuropathy: Secondary | ICD-10-CM | POA: Diagnosis not present

## 2024-02-06 DIAGNOSIS — M069 Rheumatoid arthritis, unspecified: Secondary | ICD-10-CM | POA: Diagnosis not present

## 2024-02-06 DIAGNOSIS — N1831 Chronic kidney disease, stage 3a: Secondary | ICD-10-CM | POA: Diagnosis not present

## 2024-02-06 DIAGNOSIS — E1159 Type 2 diabetes mellitus with other circulatory complications: Secondary | ICD-10-CM | POA: Diagnosis not present

## 2024-02-08 DIAGNOSIS — Z6832 Body mass index (BMI) 32.0-32.9, adult: Secondary | ICD-10-CM | POA: Diagnosis not present

## 2024-02-08 DIAGNOSIS — E1142 Type 2 diabetes mellitus with diabetic polyneuropathy: Secondary | ICD-10-CM | POA: Diagnosis not present

## 2024-02-08 DIAGNOSIS — Z794 Long term (current) use of insulin: Secondary | ICD-10-CM | POA: Diagnosis not present

## 2024-02-08 DIAGNOSIS — E66811 Obesity, class 1: Secondary | ICD-10-CM | POA: Diagnosis not present

## 2024-02-08 DIAGNOSIS — I10 Essential (primary) hypertension: Secondary | ICD-10-CM | POA: Diagnosis not present

## 2024-02-08 DIAGNOSIS — E6609 Other obesity due to excess calories: Secondary | ICD-10-CM | POA: Diagnosis not present

## 2024-02-09 DIAGNOSIS — I712 Thoracic aortic aneurysm, without rupture, unspecified: Secondary | ICD-10-CM | POA: Diagnosis not present

## 2024-02-09 DIAGNOSIS — R911 Solitary pulmonary nodule: Secondary | ICD-10-CM | POA: Diagnosis not present

## 2024-02-09 DIAGNOSIS — R001 Bradycardia, unspecified: Secondary | ICD-10-CM | POA: Diagnosis not present

## 2024-02-11 DIAGNOSIS — I493 Ventricular premature depolarization: Secondary | ICD-10-CM | POA: Diagnosis not present

## 2024-02-11 DIAGNOSIS — I471 Supraventricular tachycardia, unspecified: Secondary | ICD-10-CM | POA: Diagnosis not present

## 2024-02-11 DIAGNOSIS — I498 Other specified cardiac arrhythmias: Secondary | ICD-10-CM | POA: Diagnosis not present

## 2024-02-11 DIAGNOSIS — I4729 Other ventricular tachycardia: Secondary | ICD-10-CM | POA: Diagnosis not present

## 2024-02-12 DIAGNOSIS — I712 Thoracic aortic aneurysm, without rupture, unspecified: Secondary | ICD-10-CM | POA: Diagnosis not present

## 2024-02-16 ENCOUNTER — Telehealth: Payer: Self-pay

## 2024-02-16 DIAGNOSIS — I493 Ventricular premature depolarization: Secondary | ICD-10-CM | POA: Diagnosis not present

## 2024-02-16 DIAGNOSIS — I498 Other specified cardiac arrhythmias: Secondary | ICD-10-CM | POA: Diagnosis not present

## 2024-02-16 NOTE — Telephone Encounter (Signed)
 Copied from CRM #[id number]. Topic: Clinical - Lab/Test Results >> Feb 15, 2024 10:16 AM Russell PARAS wrote: Reason for CRM:   Pt is contacting clinic to report he had a CT of chest performed on 11/14 at [facility], that showed a new growth in lung. He is contacting his PCP today to request they fax the report over to clinic.  Wanted to notify provider about the results and speak with nurse/or provider about the results and scheduling FU appt.   CB#  [phone]   Nothing has been received yet. I will keep an eye on this and reach out if not received today.

## 2024-02-20 NOTE — Telephone Encounter (Signed)
 Scan is dated 11/17 and is in PACS. Pt has been scheduled ov with RB to review for 12/17. Nothing further needed,.

## 2024-02-28 DIAGNOSIS — M0579 Rheumatoid arthritis with rheumatoid factor of multiple sites without organ or systems involvement: Secondary | ICD-10-CM | POA: Diagnosis not present

## 2024-03-12 DIAGNOSIS — B372 Candidiasis of skin and nail: Secondary | ICD-10-CM | POA: Diagnosis not present

## 2024-03-12 DIAGNOSIS — Z6832 Body mass index (BMI) 32.0-32.9, adult: Secondary | ICD-10-CM | POA: Diagnosis not present

## 2024-03-12 DIAGNOSIS — M0579 Rheumatoid arthritis with rheumatoid factor of multiple sites without organ or systems involvement: Secondary | ICD-10-CM | POA: Diagnosis not present

## 2024-03-12 DIAGNOSIS — M1991 Primary osteoarthritis, unspecified site: Secondary | ICD-10-CM | POA: Diagnosis not present

## 2024-03-12 DIAGNOSIS — G629 Polyneuropathy, unspecified: Secondary | ICD-10-CM | POA: Diagnosis not present

## 2024-03-12 DIAGNOSIS — M25521 Pain in right elbow: Secondary | ICD-10-CM | POA: Diagnosis not present

## 2024-03-12 DIAGNOSIS — E669 Obesity, unspecified: Secondary | ICD-10-CM | POA: Diagnosis not present

## 2024-03-13 ENCOUNTER — Ambulatory Visit: Admitting: Emergency Medicine

## 2024-03-13 ENCOUNTER — Encounter: Payer: Self-pay | Admitting: Emergency Medicine

## 2024-03-13 VITALS — BP 120/54 | HR 55 | Temp 98.0°F | Ht 71.0 in | Wt 229.0 lb

## 2024-03-13 DIAGNOSIS — R918 Other nonspecific abnormal finding of lung field: Secondary | ICD-10-CM | POA: Diagnosis not present

## 2024-03-13 DIAGNOSIS — J4520 Mild intermittent asthma, uncomplicated: Secondary | ICD-10-CM | POA: Diagnosis not present

## 2024-03-13 NOTE — Assessment & Plan Note (Signed)
 Overall doing well.  He is not requiring any maintenance inhaler therapy.  Rarely needs albuterol .  Flu shot is up-to-date.  Plan to continue this regimen.

## 2024-03-13 NOTE — Assessment & Plan Note (Signed)
 Longstanding pulmonary nodules that have been stable on follow-up CT including his most recent 02/09/2024 which I reviewed.  Suspect these are rheumatoid nodules.  He does not need any more dedicated follow-up for these for my perspective but we will have interval imaging because they are also following his thoracic aorta.  I will review with him when available.

## 2024-03-13 NOTE — Progress Notes (Signed)
 Subjective:    Patient ID: Tenoch Mcclure, male    DOB: [birth year], 78 y.o.   MRN: [id number]  HPI   ROV 03/13/2024 --Mr. Paris is 52, a never smoker with a history of RA on Remicade  (now off) and methotrexate.  He has asthma/mild obstructive disease, pulmonary nodules on chest imaging.  We have managed him on albuterol  as needed.  His pulmonary nodules have overall been stable.  He gets an annual CT chest to follow a dilated ascending thoracic aortic aneurysm. He is followed by cards for episodes of bradycardia. No syncope, no sx at all. No flares - did have a single episode of chest tightness this winter that responded to his albuterol . He has had the flu shot.   CT scan of the chest done on 02/09/2024 at [facility] reviewed by me.  His pulmonary nodules are stable in appearance compared with 11/02/2022 and priors going back to 2022.    Review of Systems As per HPI   Allergies  Allergen Reactions   Penicillin G Rash     Outpatient Medications Prior to Visit  Medication Sig Dispense Refill   albuterol  (VENTOLIN  HFA) 108 (90 Base) MCG/ACT inhaler Inhale 2 puffs into the lungs every 6 (six) hours as needed. 18 g 3   ASPIRIN 81 PO Take 81 mg by mouth daily.     Calcium Carb-Cholecalciferol 1000-800 MG-UNIT TABS Take 1 tablet by mouth daily.     Cholecalciferol (VITAMIN D3) 125 MCG (5000 UT) CAPS Take 1 capsule by mouth daily.     Cyanocobalamin (B-12 PO) Take 5,000 mcg by mouth daily.     folic acid  (FOLVITE ) 1 MG tablet Take by mouth.     inFLIXimab  (REMICADE  IV) Inject 600 mg into the vein every 8 (eight) weeks.     insulin degludec (TRESIBA FLEXTOUCH) 200 UNIT/ML FlexTouch Pen 26 Units daily.     lisinopril (PRINIVIL,ZESTRIL) 5 MG tablet Take 5 mg by mouth daily.     metFORMIN (GLUCOPHAGE-XR) 500 MG 24 hr tablet 2 tablets 2 (two) times daily with a meal.     Methotrexate, PF, 10 MG/0.4ML SOAJ Inject 6 Units into the skin once a week.     No facility-administered medications prior to  visit.         Objective:   Physical Exam Vitals:   03/13/24 1410  BP: (!) 120/54  Pulse: (!) 55  Temp: 98 F (36.7 C)  TempSrc: Oral  SpO2: 95%  Weight: 229 lb (103.9 kg)  Height: 5' 11 (1.803 m)    Gen: Pleasant, well-nourished, in no distress,  normal affect  ENT: No lesions,  mouth clear,  oropharynx clear, no postnasal drip  Neck: No JVD, no stridor  Lungs: No use of accessory muscles, no crackles or wheezing on normal respiration, upper airway cough on forced expiration but no wheezing  Cardiovascular: RRR, heart sounds normal, no murmur or gallops, no peripheral edema  Musculoskeletal: No deformities, no cyanosis or clubbing  Neuro: alert, awake, non focal  Skin: Warm, no lesions or rash       Assessment & Plan:  Pulmonary nodules/lesions, multiple Longstanding pulmonary nodules that have been stable on follow-up CT including his most recent 02/09/2024 which I reviewed.  Suspect these are rheumatoid nodules.  He does not need any more dedicated follow-up for these for my perspective but we will have interval imaging because they are also following his thoracic aorta.  I will review with him when available.  Mild intermittent asthma Overall  doing well.  He is not requiring any maintenance inhaler therapy.  Rarely needs albuterol .  Flu shot is up-to-date.  Plan to continue this regimen.  I personally spent a total of 27 minutes in the care of the patient today including preparing to see the patient, getting/reviewing separately obtained history, performing a medically appropriate exam/evaluation, counseling and educating, documenting clinical information in the EHR, independently interpreting results, and communicating results.   Lamar Chris, MD, PhD 03/13/2024, 2:39 PM [facility] [phone] or if no answer before 7:00PM call [phone] For any issues after 7:00PM please call eLink [phone]

## 2024-03-13 NOTE — Patient Instructions (Signed)
 We reviewed your CT scans of the chest including your most recent scan from 02/09/2024 at [facility].  These are stable going back to 2022.  You can continue to get your serial CT scans to follow your aorta as planned.  We will review these when available.  You do not need to get any more dedicated follow-up CTs to follow the pulmonary nodules. Keep your albuterol  available to use 2 puffs if needed for shortness of breath, chest tightness, wheezing. Follow Dr. Shelah in 1 year, sooner if you have any problems.
